# Patient Record
Sex: Female | Born: 1960 | Race: Black or African American | Hispanic: No | Marital: Single | Smoking: Current every day smoker
Health system: Southern US, Community
[De-identification: ages and names within clinical notes are randomized; demographics above are authoritative.]

## PROBLEM LIST (undated history)

## (undated) DIAGNOSIS — I10 Essential (primary) hypertension: Secondary | ICD-10-CM

## (undated) DIAGNOSIS — E785 Hyperlipidemia, unspecified: Secondary | ICD-10-CM

## (undated) DIAGNOSIS — B019 Varicella without complication: Secondary | ICD-10-CM

## (undated) DIAGNOSIS — F341 Dysthymic disorder: Secondary | ICD-10-CM

## (undated) DIAGNOSIS — M1990 Unspecified osteoarthritis, unspecified site: Secondary | ICD-10-CM

## (undated) DIAGNOSIS — F17200 Nicotine dependence, unspecified, uncomplicated: Secondary | ICD-10-CM

## (undated) DIAGNOSIS — F419 Anxiety disorder, unspecified: Secondary | ICD-10-CM

## (undated) DIAGNOSIS — Z90710 Acquired absence of both cervix and uterus: Secondary | ICD-10-CM

## (undated) HISTORY — DX: Hyperlipidemia, unspecified: E78.5

## (undated) HISTORY — DX: Essential (primary) hypertension: I10

## (undated) HISTORY — DX: Nicotine dependence, unspecified, uncomplicated: F17.200

## (undated) HISTORY — DX: Varicella without complication: B01.9

## (undated) HISTORY — DX: Unspecified osteoarthritis, unspecified site: M19.90

## (undated) HISTORY — DX: Anxiety disorder, unspecified: F41.9

## (undated) HISTORY — DX: Dysthymic disorder: F34.1

## (undated) HISTORY — PX: COLONOSCOPY: SHX174

---

## 1997-07-06 HISTORY — PX: OTHER SURGICAL HISTORY: SHX169

## 1997-07-06 HISTORY — PX: ABDOMINAL HYSTERECTOMY: SHX81

## 1998-10-21 ENCOUNTER — Ambulatory Visit (HOSPITAL_BASED_OUTPATIENT_CLINIC_OR_DEPARTMENT_OTHER): Admission: RE | Admit: 1998-10-21 | Discharge: 1998-10-21 | Payer: Self-pay | Admitting: Surgery

## 2001-01-03 ENCOUNTER — Ambulatory Visit (HOSPITAL_COMMUNITY): Admission: RE | Admit: 2001-01-03 | Discharge: 2001-01-03 | Payer: Self-pay | Admitting: Gastroenterology

## 2002-09-19 ENCOUNTER — Encounter: Payer: Self-pay | Admitting: Family Medicine

## 2002-09-19 ENCOUNTER — Ambulatory Visit (HOSPITAL_COMMUNITY): Admission: RE | Admit: 2002-09-19 | Discharge: 2002-09-19 | Payer: Self-pay | Admitting: Family Medicine

## 2002-11-14 ENCOUNTER — Ambulatory Visit (HOSPITAL_COMMUNITY): Admission: RE | Admit: 2002-11-14 | Discharge: 2002-11-14 | Payer: Self-pay | Admitting: Family Medicine

## 2002-11-14 ENCOUNTER — Encounter: Payer: Self-pay | Admitting: Family Medicine

## 2003-05-09 ENCOUNTER — Observation Stay (HOSPITAL_COMMUNITY): Admission: RE | Admit: 2003-05-09 | Discharge: 2003-05-10 | Payer: Self-pay | Admitting: *Deleted

## 2003-05-09 ENCOUNTER — Encounter (INDEPENDENT_AMBULATORY_CARE_PROVIDER_SITE_OTHER): Payer: Self-pay

## 2003-09-06 ENCOUNTER — Emergency Department (HOSPITAL_COMMUNITY): Admission: EM | Admit: 2003-09-06 | Discharge: 2003-09-06 | Payer: Self-pay | Admitting: Emergency Medicine

## 2004-07-08 ENCOUNTER — Encounter: Admission: RE | Admit: 2004-07-08 | Discharge: 2004-07-08 | Payer: Self-pay | Admitting: Obstetrics and Gynecology

## 2004-08-25 ENCOUNTER — Other Ambulatory Visit: Admission: RE | Admit: 2004-08-25 | Discharge: 2004-08-25 | Payer: Self-pay | Admitting: Obstetrics and Gynecology

## 2008-09-21 ENCOUNTER — Emergency Department (HOSPITAL_COMMUNITY): Admission: EM | Admit: 2008-09-21 | Discharge: 2008-09-21 | Payer: Self-pay | Admitting: Family Medicine

## 2009-01-27 ENCOUNTER — Emergency Department (HOSPITAL_COMMUNITY): Admission: EM | Admit: 2009-01-27 | Discharge: 2009-01-27 | Payer: Self-pay | Admitting: Family Medicine

## 2010-03-16 ENCOUNTER — Emergency Department (HOSPITAL_COMMUNITY): Admission: EM | Admit: 2010-03-16 | Discharge: 2010-03-16 | Payer: Self-pay | Admitting: Emergency Medicine

## 2010-06-16 ENCOUNTER — Ambulatory Visit: Payer: Self-pay | Admitting: Internal Medicine

## 2010-06-16 DIAGNOSIS — E781 Pure hyperglyceridemia: Secondary | ICD-10-CM | POA: Insufficient documentation

## 2010-06-16 DIAGNOSIS — I10 Essential (primary) hypertension: Secondary | ICD-10-CM | POA: Insufficient documentation

## 2010-06-16 DIAGNOSIS — F17200 Nicotine dependence, unspecified, uncomplicated: Secondary | ICD-10-CM | POA: Insufficient documentation

## 2010-07-08 ENCOUNTER — Telehealth: Payer: Self-pay | Admitting: Internal Medicine

## 2010-07-08 ENCOUNTER — Ambulatory Visit
Admission: RE | Admit: 2010-07-08 | Discharge: 2010-07-08 | Payer: Self-pay | Source: Home / Self Care | Attending: Internal Medicine | Admitting: Internal Medicine

## 2010-07-08 DIAGNOSIS — F341 Dysthymic disorder: Secondary | ICD-10-CM | POA: Insufficient documentation

## 2010-08-07 NOTE — Assessment & Plan Note (Signed)
Summary: new pt/high blood pressure/bcbs/#/lb   Vital Signs:  Patient profile:   50 year old female Height:      66 inches Weight:      139 pounds BMI:     22.52 O2 Sat:      93 % on Room air Temp:     97.2 degrees F oral Pulse rate:   65 / minute BP sitting:   174 / 80  (left arm) Cuff size:   regular  Vitals Entered By: Bill Salinas CMA (June 16, 2010 9:47 AM)  O2 Flow:  Room air CC: pt here to est care with primary md, and to discuss elevated BP/ ab   Primary Care Provider:  Jacques Navy MD  CC:  pt here to est care with primary md and and to discuss elevated BP/ ab.  History of Present Illness: Patient is seen acutely due to problems with high blood pressure.l She had an episode of syncope Sept 11, 2011 and was seen at the [facility] ED. AT that time she had trichomonas and was treated with Flagyl. She did have high blood pressure and was started on HCTZ 25mg  once daily. Her lab at that time was normal with a Creatinine of 0.8 and normal electrolytes. she reports that her blood pressure her BP was in the 150's. She has not had epistaxis or other neurologic signs or symptoms.   Preventive Screening-Counseling & Management  Alcohol-Tobacco     Alcohol drinks/day: <1     Alcohol type: beer     >5/day in last 3 mos: no     Smoking Status: current     Packs/Day: 1.0  Caffeine-Diet-Exercise     Caffeine use/day: 1 cup per day     Does Patient Exercise: no  Hep-HIV-STD-Contraception     Sun Exposure-Excessive: no  Safety-Violence-Falls     Seat Belt Use: yes     Helmet Use: n/a     Firearms in the Home: no firearms in the home     Smoke Detectors: yes     Violence in the Home: no risk noted     Sexual Abuse: no     Fall Risk: low  Current Medications (verified): 1)  Hydrochlorothiazide 25 Mg Tabs (Hydrochlorothiazide) .Marland Kitchen.. 1 Tablet Once Daily  Allergies (verified): No Known Drug Allergies  Past History:  Past Medical History: UCD OTHER AND UNSPECIFIED  HYPERLIPIDEMIA (ICD-272.4) UNSPECIFIED ESSENTIAL HYPERTENSION (ICD-401.9)  Past Surgical History: Hysterectomy-fibroids '99 single oopherectomy'99  G2P1 SAB 1  Family History: father - deceased @ 51: cancer - liver? 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 Uses condoms. Smoking Status:  current Packs/Day:  1.0 Caffeine use/day:  1 cup per day Does Patient Exercise:  no Sun Exposure-Excessive:  no Seat Belt Use:  yes Fall Risk:  low  Review of Systems       The patient complains of weight loss, dyspnea on exertion, prolonged cough, and severe indigestion/heartburn.  The patient denies anorexia, fever, weight gain, vision loss, decreased hearing, chest pain, syncope, headaches, hemoptysis, abdominal pain, incontinence, genital sores, suspicious skin lesions, difficulty walking, unusual weight change, abnormal bleeding, angioedema, and breast masses.         15 lbs over 12 months - unintential. Chronic AM cough- productive of a teaspoon of sputum. has GERD  and self medicates with H2 blocker OTC  Physical Exam  General:  Well-developed,well-nourished,in no acute distress; alert,appropriate and cooperative throughout examination Head:  Normocephalic and atraumatic without obvious abnormalities. No apparent alopecia or balding. Eyes:  pupils equal, pupils round, corneas and lenses clear, and no injection.   Ears:  External ear exam shows no significant lesions or deformities.  Otoscopic examination reveals clear canals, tympanic membranes are intact bilaterally without bulging, retraction, inflammation or discharge. Hearing is grossly normal bilaterally. Mouth:  Oral mucosa and oropharynx without lesions or exudates.  Teeth in good repair but need cleaning. Missing premolars  maxilla Neck:  supple, full ROM, no thyromegaly, and no carotid bruits.   Chest Wall:  No deformities, masses, or tenderness noted. Breasts:  No mass, nodules, thickening, tenderness, bulging, retraction, inflamation, nipple discharge or skin changes noted.   Lungs:  normal respiratory effort, normal breath sounds, no crackles, and no wheezes.   Heart:  normal rate and regular rhythm.   Abdomen:  soft, non-tender, normal bowel sounds, no guarding, and no hepatomegaly.   Msk:  normal ROM, no joint tenderness, no joint swelling, and no joint deformities.   Pulses:  2+ radial bilaterally Extremities:  No clubbing, cyanosis, edema, or deformity noted with normal full range of motion of all joints.   Neurologic:  alert & oriented X3, cranial nerves II-XII intact, gait normal, and DTRs symmetrical and normal.   Skin:  turgor normal, color normal, no rashes, and no suspicious lesions.   Cervical Nodes:  no anterior cervical adenopathy and no posterior cervical adenopathy.  No supraclavicular nodes Axillary Nodes:  no R axillary adenopathy and no L axillary adenopathy.   Psych:  Oriented X3, normally interactive, good eye contact, and not anxious appearing.     Impression & Recommendations:  Problem # 1:  UNSPECIFIED ESSENTIAL HYPERTENSION (ICD-401.9) Poor control on HCTZ alone. No stigmata of advanced disease. Normal renal function in Sept '11  Plan - CXR           lisinopril/hct 10/12.5           ROV 3 weeks for f/u and Bmet  Her updated medication list for this problem includes:    Lisinopril-hydrochlorothiazide 10-12.5 Mg Tabs (Lisinopril-hydrochlorothiazide) .Marland Kitchen... 1 by mouth once daily for high blood pressure  Problem # 2:  SMOKER (ICD-305.1) Counseled to stop smoking  Orders: T-2 View CXR (71020TC)  Problem # 3:  WEIGHT LOSS, ABNORMAL (ICD-783.21) patient with 215lb weight loss over the past year. Pt attributes this to poor appetite when stressed/depressed. She is a smoker. No  change in bowel habit. No night sweats or adenopathy. Exam with normal breast exam. She is s/p hysterectomy  Plan - PA Lat chest x-ray           If negative x-ray and continued weight loss will need colonoscopy.  Problem # 4:  Preventive Health Care (ICD-V70.0) History significant for BP. Exam is normal. Labs from ED reviewed and normal, including normal breast exam. Patient is s/p hysterectomy and does not need Pelvic and PAP and did have pelvic Sept '11 in ED. She will need routine lab, e.g. lipid panel.  In summary - a nice patient welcomed and oriented to the practice. She will return in 3 weeks as noted.   Complete Medication List: 1)  Lisinopril-hydrochlorothiazide 10-12.5 Mg Tabs (Lisinopril-hydrochlorothiazide) .Marland Kitchen.. 1 by mouth once daily for high blood pressure  DG CHEST 2 VIEW - [id number]   Clinical Data: 15, weight loss, smoker.   CHEST -  2 VIEW   Comparison: None   Findings: Heart and mediastinal contours are within normal limits. No focal opacities or effusions.  No acute bony abnormality.   IMPRESSION: No active disease.   Read By:  Charlett Nose,  M.D.Prescriptions: LISINOPRIL-HYDROCHLOROTHIAZIDE 10-12.5 MG TABS (LISINOPRIL-HYDROCHLOROTHIAZIDE) 1 by mouth once daily for high blood pressure  #30 x 12   Entered and Authorized by:   Jacques Navy MD   Signed by:   Jacques Navy MD on 06/16/2010   Method used:   [facility] to        [facility]  [address] [phone]* (retail)       [address]       [place], [place]  [address]       Ph: [phone] or [phone]       Fax: [phone]   RxID:   [id number]    Orders Added: 1)  T-2 View CXR [71020TC] 2)  New Patient Level IV [36644]

## 2010-08-07 NOTE — Progress Notes (Signed)
Summary: REQ FOR RX   Phone Note Call from Patient Call back at Home Phone [phone]   Summary of Call: Patient is requesting a call back regarding an rx.  Initial call taken by: Lamar Sprinkles, CMA,  July 08, 2010 3:16 PM  Follow-up for Phone Call        Pt would like rx for depression & to help w/sleep. She would like a call when complete. [facility]  Follow-up by: Lamar Sprinkles, CMA,  July 08, 2010 6:02 PM  Additional Follow-up for Phone Call Additional follow up Details #1::        OK - we discussed this at today's visit. Sertraline 50mg  1 by mouth at bedtime. # 30, refillx 5  Called patient Additional Follow-up by: Jacques Navy MD,  July 08, 2010 6:10 PM    New/Updated Medications: SERTRALINE HCL 50 MG TABS (SERTRALINE HCL) 1 by mouth at bedtime for depression and anxiety Prescriptions: SERTRALINE HCL 50 MG TABS (SERTRALINE HCL) 1 by mouth at bedtime for depression and anxiety  #30 x 5   Entered and Authorized by:   Jacques Navy MD   Signed by:   Jacques Navy MD on 07/08/2010   Method used:   [facility]        [facility]  [address] [phone]* (retail)       [address]       [place], [place]  [address]       Ph: [phone] or [phone]       Fax: [phone]   RxID:   [id number]

## 2010-08-07 NOTE — Assessment & Plan Note (Signed)
Summary: 3 wk f/u #/cd   Vital Signs:  Patient profile:   50 year old female Height:      66 inches Weight:      136 pounds BMI:     22.03 Temp:     97.5 degrees F oral BP sitting:   146 / 86  (left arm) Cuff size:   regular  Vitals Entered By: Bill Salinas CMA (July 08, 2010 10:58 AM) CC: pt here for follow up on BP after starting lisinopril/ ab   Primary Care Provider:  Jacques Navy MD  CC:  pt here for follow up on BP after starting lisinopril/ ab.  History of Present Illness: Patient presents for follow up of hypertension. she was seen recently and started on lisinopril/Hct. She is tolerting medication well without adverse effects.  She reports that she is not doing well. On questioning her she reports that things are very stressfull but she does not give details. She does admit to change in appetite, anhedonia, tearfullness, sadness, perseveration, change in sleep pattern. She denies any abuse. She has been talking with her sister and mother but has not engaged in counseling. She does have a h/o emotional problems with prior h/o potential self-harm for which she was in counseling. She denies any ideation of self-harm at this time.   Current Medications (verified): 1)  Lisinopril-Hydrochlorothiazide 10-12.5 Mg Tabs (Lisinopril-Hydrochlorothiazide) .Marland Kitchen.. 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 Uses condoms.  Review of Systems       The patient  complains of depression.  The patient denies anorexia, weight loss, weight gain, chest pain, dyspnea on exertion, abdominal pain, severe indigestion/heartburn, difficulty walking, unusual weight change, and abnormal bleeding.    Physical Exam  General:  Well-developed,well-nourished,in no acute distress; alert,appropriate and cooperative throughout examination Head:  normocephalic and atraumatic.   Eyes:  pupils equal and pupils round.   Lungs:  normal respiratory effort.   Heart:  normal rate and regular rhythm.   Neurologic:  alert & oriented X3, cranial nerves II-XII intact, and gait normal.   Skin:  turgor normal and color normal.   Psych:  Oriented X3, memory intact for recent and remote, normally interactive, good eye contact, labile affect, and tearful.     Impression & Recommendations:  Problem # 1:  UNSPECIFIED ESSENTIAL HYPERTENSION (ICD-401.9)  Her updated medication list for this problem includes:    Lisinopril-hydrochlorothiazide 10-12.5 Mg Tabs (Lisinopril-hydrochlorothiazide) .Marland Kitchen... 1 by mouth once daily for high blood pressure  BP today: 146/86 Prior BP: 174/80 (06/16/2010)  Improved blood pressure althoug not at goal. Extenuating circumstances, see below, may be contributing to elevated readings.  Plan - continue present medication           recheck in 1 month  Problem # 2:  ANXIETY DEPRESSION (ICD-300.4) Patient with multiple vegative sings of depression. she does not share the content of her problems. Discussed with her the options of medicaton to help her symptoms. Also strongly encouraged her to return to the counselor she had worked with in the past.  Plan - counseling           medical mgt - sertraline 50mg  at bedtime           return in 1 month for follow-up.  Complete Medication List: 1)  Lisinopril-hydrochlorothiazide 10-12.5 Mg Tabs (Lisinopril-hydrochlorothiazide) .Marland Kitchen.. 1 by mouth once daily for high blood pressure 2)  Sertraline Hcl 50 Mg Tabs  (Sertraline hcl) .Marland Kitchen.. 1 by mouth at bedtime for depression and anxiety   Orders Added: 1)  Est. Patient Level III [16109]

## 2010-08-08 ENCOUNTER — Ambulatory Visit (INDEPENDENT_AMBULATORY_CARE_PROVIDER_SITE_OTHER): Payer: BC Managed Care – PPO | Admitting: Internal Medicine

## 2010-08-08 ENCOUNTER — Ambulatory Visit: Admit: 2010-08-08 | Payer: Self-pay | Admitting: Internal Medicine

## 2010-08-08 ENCOUNTER — Encounter: Payer: Self-pay | Admitting: Internal Medicine

## 2010-08-08 DIAGNOSIS — I10 Essential (primary) hypertension: Secondary | ICD-10-CM

## 2010-08-08 DIAGNOSIS — F341 Dysthymic disorder: Secondary | ICD-10-CM

## 2010-08-13 NOTE — Assessment & Plan Note (Signed)
Summary: ONE MONTH FOLLOW UP - LB   Vital Signs:  Patient profile:   50 year old female Height:      66 inches Weight:      137 pounds BMI:     22.19 O2 Sat:      99 % on Room air Temp:     97.6 degrees F oral Pulse rate:   53 / minute BP sitting:   122 / 80  (left arm) Cuff size:   regular  Vitals Entered By: Bill Salinas CMA (August 08, 2010 1:19 PM)  O2 Flow:  Room air CC: pt here for 3 month follow up. She would like to discuss sertraline and a poss alternative/ ab   Primary Care Provider:  Jacques Navy MD  CC:  pt here for 3 month follow up. She would like to discuss sertraline and a poss alternative/ ab.  History of Present Illness: Ms. Halpin returns for follow-up of depression and use of sertraline. Unfortunately she was intolerant with dizzyness and impaired cognition. She stopped meds after 2 weeks. She has not had any counseling - hard to find the time and the $$. She relates that she has been talking to friends and letting go of some of the tension and does feel a little better. She is interested in trying another product for her depression.   Current Medications (verified): 1)  Lisinopril-Hydrochlorothiazide 10-12.5 Mg Tabs (Lisinopril-Hydrochlorothiazide) .Marland Kitchen.. 1 By Mouth Once Daily For High Blood Pressure 2)  Sertraline Hcl 50 Mg Tabs (Sertraline Hcl) .Marland Kitchen.. 1 By Mouth At Bedtime For Depression and Anxiety  Allergies (verified): No Known Drug Allergies PMH-FH-SH reviewed-no changes except otherwise noted  Physical Exam  General:  Well-developed,well-nourished,in no acute distress; alert,appropriate and cooperative throughout examination Head:  normocephalic and atraumatic.   Eyes:  C&S clear Lungs:  normal respiratory effort.   Heart:  normal rate and regular rhythm.     Impression & Recommendations:  Problem # 1:  ANXIETY DEPRESSION (ICD-300.4) a little better but still depressed and emotional  Plan - encouraged counseling - perhaps ministerial  through her church           trial of pristiq - 50mg  once daily #7 - if tolerated will Rx for Venlafaxine 75 ER.   Problem # 2:  UNSPECIFIED ESSENTIAL HYPERTENSION (ICD-401.9)  Her updated medication list for this problem includes:    Lisinopril-hydrochlorothiazide 10-12.5 Mg Tabs (Lisinopril-hydrochlorothiazide) .Marland Kitchen... 1 by mouth once daily for high blood pressure  BP today: 122/80 Prior BP: 146/86 (07/08/2010)  Good control.   Complete Medication List: 1)  Lisinopril-hydrochlorothiazide 10-12.5 Mg Tabs (Lisinopril-hydrochlorothiazide) .Marland Kitchen.. 1 by mouth once daily for high blood pressure 2)  Sertraline Hcl 50 Mg Tabs (Sertraline hcl) .Marland Kitchen.. 1 by mouth at bedtime for depression and anxiety   Orders Added: 1)  Est. Patient Level II [29562]

## 2010-09-18 LAB — URINALYSIS, ROUTINE W REFLEX MICROSCOPIC
Bilirubin Urine: NEGATIVE
Glucose, UA: NEGATIVE mg/dL
Ketones, ur: NEGATIVE mg/dL
Nitrite: NEGATIVE
Protein, ur: NEGATIVE mg/dL
Specific Gravity, Urine: 1.017 (ref 1.005–1.030)
Urobilinogen, UA: 0.2 mg/dL (ref 0.0–1.0)
pH: 5.5 (ref 5.0–8.0)

## 2010-09-18 LAB — URINE MICROSCOPIC-ADD ON

## 2010-09-18 LAB — POCT I-STAT, CHEM 8
BUN: 11 mg/dL (ref 6–23)
Calcium, Ion: 1.1 mmol/L — ABNORMAL LOW (ref 1.12–1.32)
Chloride: 106 mEq/L (ref 96–112)
Creatinine, Ser: 0.8 mg/dL (ref 0.4–1.2)
Glucose, Bld: 101 mg/dL — ABNORMAL HIGH (ref 70–99)
HCT: 40 % (ref 36.0–46.0)
Hemoglobin: 13.6 g/dL (ref 12.0–15.0)
Potassium: 3.8 mEq/L (ref 3.5–5.1)
Sodium: 142 mEq/L (ref 135–145)
TCO2: 24 mmol/L (ref 0–100)

## 2010-09-18 LAB — WET PREP, GENITAL: Yeast Wet Prep HPF POC: NONE SEEN

## 2010-09-18 LAB — POCT PREGNANCY, URINE: Preg Test, Ur: NEGATIVE

## 2010-09-18 LAB — RPR: RPR Ser Ql: NONREACTIVE

## 2010-09-18 LAB — GC/CHLAMYDIA PROBE AMP, GENITAL
Chlamydia, DNA Probe: NEGATIVE
GC Probe Amp, Genital: NEGATIVE

## 2010-11-21 NOTE — Op Note (Signed)
NAME:  Jessica Lara, Jessica Lara                         ACCOUNT NO.:  [id number]   MEDICAL RECORD NO.:  [id number]                   PATIENT TYPE:  OBV   LOCATION:  9326                                 FACILITY:  [facility]   PHYSICIAN:  Tracie Harrier, M.D.              DATE OF BIRTH:  [birth year]   DATE OF PROCEDURE:  05/10/2003  DATE OF DISCHARGE:                                 OPERATIVE REPORT   PREOPERATIVE DIAGNOSES:  1. Abnormal uterine bleeding.  2. Bilateral hydrosalpinges.  3. Uterine fibroid.   POSTOPERATIVE DIAGNOSES:  1. Abnormal uterine bleeding.  2. Bilateral hydrosalpinges.  3. Uterine fibroid.  4. Bilateral and moderate pelvic adhesions.   PROCEDURES:  1. Laparoscopically-assisted vaginal hysterectomy.  2. Lysis of adhesions.  3. Bilateral salpingectomy.   SURGEON:  Tracie Harrier, M.D.   ASSISTANTFreddy Finner, M.D.   ANESTHESIA:  General.   ESTIMATED BLOOD LOSS:  100 mL.   COMPLICATIONS:  None.   FINDINGS:  At the time of hysterectomy, pelvic adhesions were noted in the  posterior aspect of the pelvis.  This was most notable on the right pelvic  sidewall.  There were also multiple adhesions between the uterus, adnexa,  and the sigmoid colon.   Otherwise, the ovaries were visualized and noted to be normal.  The bowel  was visualized and noted to be normal.  The appendix was not visualized and  noted to be normal.  The appendix was not visualized.   DESCRIPTION OF PROCEDURE:  The patient was taken to the operating room,  where a general endotracheal anesthetic was administered.  The patient was  placed on the operating table in the dorsal lithotomy position.  The  abdomen, perineum, and vagina were prepped and draped in the usual sterile  fashion with Betadine and sterile drapes.  A red rubber catheter was used to  empty the bladder.  Next, a small transverse infraumbilical skin incision  was made through which a Veress needle was inserted atraumatically  into the  abdominal cavity.  A pneumoperitoneum was then created with carbon dioxide  gas until an intra-abdominal pressure of 15 mmHg was created.  The Veress  needle was then removed and the disposable laparoscopic trocar was inserted  atraumatically into the abdominal cavity.  A second incision was made two  fingerbreadths above the pubic symphysis and in the midline.  Through this a  5 mm trocar was inserted atraumatically.  Lysis of adhesions was then  carried out using the laparoscopic scissors until the anatomy was  normalized.  The tubes were then removed with the assistance of the Gyrus 5  mm bipolar device.  The tubes were dissected off the underlying mesosalpinx  with a burn and cut technique.  Good hemostasis was noted and the tubes were  dissected free.  Attention was then turned to hysterectomy.   The uterine-ovarian ligaments were grasped bilaterally, cauterized  thoroughly, and divided using the Gyrus device.  Dissection was then carried  out to remove the uterus in the standard fashion until about the level of  the uterine arteries bilaterally. Again a burn and cut technique was  undertaken with the Gyrus device.  All vascular pedicles were created close  to the body of the uterus and again dissection carried out inferiorly with  good hemostasis and good dissection achieved.  The uterus was removed until  the level of the uterine artery.  A Foley catheter was inserted during this  time for operative ease.  The pelvis was thoroughly irrigated with the  Nezhat-Dorsey irrigator and hemostasis was noted.   Next the vaginal portion of this procedure was undertaken.  A weighted  speculum was placed in the posterior fornix of the vagina.  The cervix was  grasped with a Jacobs tenaculum and the posterior cul-de-sac was sharply and  atraumatically entered.  The uterosacral ligaments were grasped bilaterally  using the LigaSure forceps device.  All vascular pedicles were then   cauterized and divided with the Mayo scissors.  A circumferential incision  was made around the anterior portion of the cervix and the bladder flap was  dissected cephalad using blunt and sharp dissection.  The anterior cul-de-  sac was atraumatically entered and a curved Deaver placed behind this to  assure its protection during the procedure.  Successive bites were then  carried up the body of the uterus using the LigaSure device.  All vascular  pedicles were once again created close to the body of the uterus, cauterized  thoroughly, and divided sharply.  This was done until the operative suture  lines were met.  The uterus was then handed off in total.  Attention was  then turned to closure.  First the uterosacral ligaments were attached to  the upper vagina using a transfixing suture of 0 Vicryl bilaterally.  The  posterior cul-de-sac was obliterated by tying the uterosacral ligaments in  the midline using a suture of 0 Vicryl suture.  The vaginal cuff was then  closed in an anterior to posterior fashion using multiple interrupted  sutures of 0 Vicryl.  Good hemostasis was noted.  Good cuff reapproximation  was identified.   After the vaginal portion of this was completed, the abdomen was once again  insufflated with carbon dioxide and the operative areas were visualized  thoroughly.  The pelvis was once again irrigated using the Nezhat-Dorsey  irrigator and hemostasis was noted.  Attention was then turned to closure.  All gas was allowed to escape from the abdomen and all abdominal instruments  were removed.  The infraumbilical skin incision was closed first with three  interrupted sutures of 0 Vicryl on the muscle fascia.  The skin of the  umbilical site was closed with multiple interrupted sutures of 3-0 Vicryl  Rapide.  The suprapubic site was closed with Dermabond glue.   The patient was then awakened, extubated, and taken to the recovery room in good condition.  There were no  perioperative complications, and blood loss  was 100 mL.  The patient received an antibiotic prior to surgery.  Final  sponge and needle count was correct.                                               Tracie Harrier, M.D.  REG/MEDQ  D:  05/10/2003  T:  05/10/2003  Job:  [id number]

## 2010-11-21 NOTE — Procedures (Signed)
[facility]  Patient:    Jessica Lara, Jessica Lara                      MRN: [id number] Proc. Date: 01/03/01 Adm. Date:  [id number] Attending:  Orland Mustard CC:         Al Decant. Janey Greaser, M.D.   Procedure Report  PROCEDURE PERFORMED:  Colonoscopy.  ENDOSCOPIST:  Llana Aliment. Randa Evens, M.D.  MEDICATIONS USED:  Fentanyl 87.5 mcg, Versed 10 mg IV.  INSTRUMENT:  Pediatric colonoscope.  INDICATIONS:  The patient is a 50 year old woman with rectal bleeding.  This has been presumed to be due to an anal fissure.  She has had previous anal fissures and has had repair for that.  Of note, however, is that her father died of colon cancer at age 3.  Given the fact that her father died at such a young age and she is having progressive rectal bleeding, colonoscopy was performed.  DESCRIPTION OF PROCEDURE:  The procedure had been explained to the patient and consent obtained.  With the patient in the left lateral decubitus position, a digital exam was performed and the pediatric Olympus video colonoscope was inserted.  There were obvious surgical changes to the anal area.  We were able to easily advance to the cecum without difficulty.  The ileocecal valve and appendiceal orifice were seen.  The scope was withdrawn.  The cecum, ascending colon, hepatic flexure, transverse colon, splenic flexure, descending and sigmoid colon were seen well upon withdrawal.  No polyps were seen.  Scope withdrawn to the rectum.  The rectum was free of polyps.  Upon removing the scope, the patient was seen to have internal hemorrhoids.  The patient tolerated the procedure well.  Maintained on low flow oxygen and pulse oximeter throughout the procedure with no obvious problem.  ASSESSMENT: 1. Internal hemorrhoids probably the source of her bleeding. 2. No evidence of polyps in this very high risk individual.  PLAN:  Will recommend repeating in five years.  Will give a hemorrhoid  sheet. See back as needed. DD:  01/03/01 TD:  01/03/01 Job: [id number]

## 2010-11-21 NOTE — H&P (Signed)
NAME:  KELECHI, ASTARITA                         ACCOUNT NO.:  [id number]   MEDICAL RECORD NO.:  [id number]                   PATIENT TYPE:  OBV   LOCATION:  NA                                   FACILITY:  [facility]   PHYSICIAN:  Tracie Harrier, M.D.              DATE OF BIRTH:  1961-02-03   DATE OF ADMISSION:  DATE OF DISCHARGE:                                HISTORY & PHYSICAL   HISTORY OF PRESENT ILLNESS:  Ms. Hudson is a 49 year old female gravida 2,  para 1 with uterine fibroids and bilateral hydrosalpinges.  She is admitted  to undergo definitive therapy of abnormal uterine bleeding and uterine  fibroids.  She has had heavy, lengthy menstrual cycles occurring frequently  about every two weeks.  This is clearly abnormal uterine bleeding.  On  ultrasound, she is also noted to have bilateral hydrosalpinges.  After  informed consent, she is admitted to undergo definitive therapy of these  above-mentioned problems.   She is a smoker of 1/2 pack per day but is generally in good health.   PAST MEDICAL HISTORY:  None.   PAST SURGICAL HISTORY:  1. Hysteroscopy with resection of uterine fibroid.  2. Repair of anal fistula.   PAST OBSTETRICAL HISTORY:  1. SAB x1.  2. Normal spontaneous vaginal delivery at about 36 weeks.   CURRENT MEDICATIONS:  Oral contraception.   ALLERGIES:  None known.   PHYSICAL EXAMINATION:  VITAL SIGNS:  Stable, blood pressure 120/78.  GENERAL:  She is a well-developed, well-nourished female in no acute  distress.  HEENT:  Within normal limits.  NECK:  Supple without adenopathy or thyromegaly.  HEART:  Regular rate and rhythm without murmurs, rubs, or gallops.  LUNGS:  Clear to auscultation.  BREASTS:  Exam deferred.  ABDOMEN:  Soft and benign without masses, tenderness, hernia, or  organomegaly.  PELVIC:  Normal external female genitalia, vagina and cervix clear.  The  uterus is upper limits of normal.  The adnexa is clear of mass.   Ultrasound  consistent with one intramural fibroid and bilateral  hydrosalpinges.   ASSESSMENT:  1. Abnormal uterine bleeding.  2. Bilateral hydrosalpinges.  3. Uterine fibroid.    PLAN:  1. Laparoscopic-assisted vaginal hysterectomy.  2. Bilateral salpingectomy.   DISCUSSION:  The risks and benefits of this procedure is discussed with the  patient.  The risks of bleeding, infection, risks of injuries to surrounding  organs is reviewed.  Also the possibility for laparotomy discussed with the  patient.  Questions are answered regarding this procedure and alternative  therapies.                                               Tracie Harrier, M.D.    REG/MEDQ  D:  05/08/2003  T:  05/08/2003  Job:  [id number]

## 2011-05-13 ENCOUNTER — Encounter: Payer: Self-pay | Admitting: Internal Medicine

## 2011-05-14 ENCOUNTER — Other Ambulatory Visit (INDEPENDENT_AMBULATORY_CARE_PROVIDER_SITE_OTHER): Payer: BC Managed Care – PPO

## 2011-05-14 ENCOUNTER — Ambulatory Visit (INDEPENDENT_AMBULATORY_CARE_PROVIDER_SITE_OTHER): Payer: BC Managed Care – PPO | Admitting: Internal Medicine

## 2011-05-14 ENCOUNTER — Encounter: Payer: Self-pay | Admitting: Internal Medicine

## 2011-05-14 VITALS — BP 140/82 | HR 63 | Temp 98.0°F

## 2011-05-14 DIAGNOSIS — R011 Cardiac murmur, unspecified: Secondary | ICD-10-CM

## 2011-05-14 DIAGNOSIS — R209 Unspecified disturbances of skin sensation: Secondary | ICD-10-CM

## 2011-05-14 DIAGNOSIS — R55 Syncope and collapse: Secondary | ICD-10-CM

## 2011-05-14 DIAGNOSIS — R200 Anesthesia of skin: Secondary | ICD-10-CM

## 2011-05-14 DIAGNOSIS — I10 Essential (primary) hypertension: Secondary | ICD-10-CM

## 2011-05-14 LAB — BASIC METABOLIC PANEL
BUN: 21 mg/dL (ref 6–23)
CO2: 29 mEq/L (ref 19–32)
Calcium: 9.6 mg/dL (ref 8.4–10.5)
Chloride: 106 mEq/L (ref 96–112)
Creatinine, Ser: 0.8 mg/dL (ref 0.4–1.2)
GFR: 99.04 mL/min (ref >60.00)
Glucose, Bld: 118 mg/dL — ABNORMAL HIGH (ref 70–99)
Potassium: 4.2 mEq/L (ref 3.5–5.1)
Sodium: 142 mEq/L (ref 135–145)

## 2011-05-14 LAB — HEPATIC FUNCTION PANEL
ALT: 16 U/L (ref 0–35)
AST: 21 U/L (ref 0–37)
Albumin: 4.9 g/dL (ref 3.5–5.2)
Alkaline Phosphatase: 51 U/L (ref 39–117)
Bilirubin, Direct: 0 mg/dL (ref 0.0–0.3)
Total Bilirubin: 0.5 mg/dL (ref 0.3–1.2)
Total Protein: 8 g/dL (ref 6.0–8.3)

## 2011-05-14 LAB — CBC WITH DIFFERENTIAL/PLATELET
Basophils Absolute: 0 10*3/uL (ref 0.0–0.1)
Basophils Relative: 0.4 % (ref 0.0–3.0)
Eosinophils Absolute: 0 10*3/uL (ref 0.0–0.7)
Eosinophils Relative: 0.8 % (ref 0.0–5.0)
HCT: 38.6 % (ref 36.0–46.0)
Hemoglobin: 13.3 g/dL (ref 12.0–15.0)
Lymphocytes Relative: 22.6 % (ref 12.0–46.0)
Lymphs Abs: 1.1 10*3/uL (ref 0.7–4.0)
MCHC: 34.3 g/dL (ref 30.0–36.0)
MCV: 92.5 fl (ref 78.0–100.0)
Monocytes Absolute: 0.4 10*3/uL (ref 0.1–1.0)
Monocytes Relative: 8.6 % (ref 3.0–12.0)
Neutro Abs: 3.3 10*3/uL (ref 1.4–7.7)
Neutrophils Relative %: 67.6 % (ref 43.0–77.0)
Platelets: 208 10*3/uL (ref 150.0–400.0)
RBC: 4.18 Mil/uL (ref 3.87–5.11)
RDW: 13.4 % (ref 11.5–14.6)
WBC: 4.9 10*3/uL (ref 4.5–10.5)

## 2011-05-14 LAB — TSH: TSH: 1.09 u[IU]/mL (ref 0.35–5.50)

## 2011-05-14 MED ORDER — ASPIRIN EC 81 MG PO TBEC: 81.0000 mg | DELAYED_RELEASE_TABLET | Freq: Every day | ORAL | Status: DC

## 2011-05-14 MED ORDER — LISINOPRIL-HYDROCHLOROTHIAZIDE 20-12.5 MG PO TABS: 1.0000 | ORAL_TABLET | Freq: Every day | ORAL | Status: DC

## 2011-05-14 NOTE — Progress Notes (Signed)
  Subjective:    Patient ID: Jessica Lara, female    DOB: 1961/04/14, 50 y.o.   MRN: [id number]  HPI  complains of ?uncontrolled blood pressure - "feels high" but not verified during spells Onset "spells" 3 months ago Episodic spells of dizziness, diffuse body numbness, and tunnel sensation  Episodes occur 3-4x/week, usually at work Near syncope sensation x 2 but no loss of consciousness or incontinence, seizure or Reports compliance with prescribed antihypertensives, but off sertraline since spring 2012 - takes "prn" No headache, no vision change, no unilateral weakness or numbness No dysarthria or facial asymmetry -  no trouble with hand coordination or writing coordination but numb in R index finger x 2 weeks  Past Medical History  Diagnosis Date  . ANXIETY DEPRESSION   . Other and unspecified hyperlipidemia   . SMOKER   . Unspecified essential hypertension      Review of Systems  Constitutional: Negative for fever and fatigue.  Respiratory: Negative for cough and shortness of breath.   Cardiovascular: Negative for palpitations and leg swelling.  Neurological: Negative for tremors, speech difficulty, weakness and headaches.       Objective:   Physical Exam BP 140/82  Pulse 63  Temp(Src) 98 F (36.7 C) (Oral)  SpO2 99% Wt Readings from Last 3 Encounters:  08/08/10 137 lb (62.143 kg)  07/08/10 136 lb (61.689 kg)  06/16/10 139 lb (63.05 kg)   Constitutional: She appears well-developed and well-nourished. No distress.  HENT: Head: Normocephalic and atraumatic. Ears: B TMs ok, no erythema or effusion; Nose: Nose normal.  Mouth/Throat: Oropharynx is clear and moist. No oropharyngeal exudate.  Eyes: Conjunctivae and EOM are normal. Pupils are equal, round, and reactive to light. No scleral icterus.  Neck: Normal range of motion. Neck supple. No carotid bruits. No JVD present. No thyromegaly present.  Cardiovascular: Normal rate, regular rhythm and normal heart sounds.   2/6 holosyst murmur heard. No BLE edema. Pulmonary/Chest: Effort normal and breath sounds normal. No respiratory distress. She has no wheezes. Mskel: R hand without edema, deformity - B equal grip Neurological: She is alert and oriented to person, place, and time. No cranial nerve deficit. Coordination normal. Normal F>N Psychiatric: She has a normal mood and affect. Her behavior is normal. Judgment and thought content normal.   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

## 2011-05-14 NOTE — Patient Instructions (Signed)
It was good to see you today. You should consider giving up cigarettes and smoking. This is very bad for your health. If we can help you quit, please let us know Start baby aspirin once daily Increase dose of lisinopril HCT today for blood pressure control - Your prescription(s) have been submitted to your pharmacy. Please take as directed and contact our office if you believe you are having problem(s) with the medication(s). Test(s) ordered today. Your results will be called to you after review (48-72hours after test completion). If any changes need to be made, you will be notified at that time. we'll make referral for echocardiogram to evaluate her heart. Our office will contact you regarding appointment(s) once made. Please schedule followup in 2-4 weeks with Dr. Debby Bud, call sooner if problems.

## 2011-05-15 ENCOUNTER — Encounter: Payer: Self-pay | Admitting: Internal Medicine

## 2011-05-15 DIAGNOSIS — R739 Hyperglycemia, unspecified: Secondary | ICD-10-CM | POA: Insufficient documentation

## 2011-05-19 LAB — HEMOGLOBIN A1C: Hgb A1c MFr Bld: 5.7 % (ref 4.6–6.5)

## 2011-05-26 ENCOUNTER — Ambulatory Visit (HOSPITAL_COMMUNITY): Payer: BC Managed Care – PPO | Attending: Cardiovascular Disease

## 2011-05-26 ENCOUNTER — Ambulatory Visit: Payer: BC Managed Care – PPO | Admitting: Internal Medicine

## 2011-05-26 DIAGNOSIS — I359 Nonrheumatic aortic valve disorder, unspecified: Secondary | ICD-10-CM | POA: Insufficient documentation

## 2011-05-26 DIAGNOSIS — I379 Nonrheumatic pulmonary valve disorder, unspecified: Secondary | ICD-10-CM | POA: Insufficient documentation

## 2011-05-26 DIAGNOSIS — I059 Rheumatic mitral valve disease, unspecified: Secondary | ICD-10-CM | POA: Insufficient documentation

## 2011-05-26 DIAGNOSIS — R011 Cardiac murmur, unspecified: Secondary | ICD-10-CM | POA: Insufficient documentation

## 2011-05-26 DIAGNOSIS — I10 Essential (primary) hypertension: Secondary | ICD-10-CM

## 2011-05-26 DIAGNOSIS — R55 Syncope and collapse: Secondary | ICD-10-CM | POA: Insufficient documentation

## 2011-05-26 DIAGNOSIS — I079 Rheumatic tricuspid valve disease, unspecified: Secondary | ICD-10-CM | POA: Insufficient documentation

## 2011-06-02 ENCOUNTER — Ambulatory Visit (INDEPENDENT_AMBULATORY_CARE_PROVIDER_SITE_OTHER): Payer: BC Managed Care – PPO | Admitting: Internal Medicine

## 2011-06-02 DIAGNOSIS — Z0289 Encounter for other administrative examinations: Secondary | ICD-10-CM

## 2011-06-02 DIAGNOSIS — I10 Essential (primary) hypertension: Secondary | ICD-10-CM

## 2011-06-03 NOTE — Progress Notes (Signed)
  Subjective:    Patient ID: Jessica Lara, female    DOB: 07-28-1960, 50 y.o.   MRN: [id number]  HPI No show - no charge    Review of Systems     Objective:   Physical Exam        Assessment & Plan:

## 2011-08-24 ENCOUNTER — Emergency Department (INDEPENDENT_AMBULATORY_CARE_PROVIDER_SITE_OTHER)
Admission: EM | Admit: 2011-08-24 | Discharge: 2011-08-24 | Disposition: A | Discharge status: Home / Self Care | Payer: BC Managed Care – PPO | Source: Home / Self Care | Attending: Family Medicine | Admitting: Family Medicine

## 2011-08-24 ENCOUNTER — Encounter (HOSPITAL_COMMUNITY): Payer: Self-pay | Admitting: Cardiology

## 2011-08-24 DIAGNOSIS — B354 Tinea corporis: Secondary | ICD-10-CM

## 2011-08-24 MED ORDER — HYDROXYZINE HCL 50 MG PO TABS: 50.0000 mg | ORAL_TABLET | Freq: Three times a day (TID) | ORAL | Status: AC | PRN

## 2011-08-24 MED ORDER — KETOCONAZOLE 2 % EX GEL: CUTANEOUS | Status: DC

## 2011-08-24 MED ORDER — FLUCONAZOLE 200 MG PO TABS: ORAL_TABLET | ORAL | Status: DC

## 2011-08-24 NOTE — Discharge Instructions (Signed)
Use Selsum blue shampoo in affected skin at bed time and rinse the next day. Use the prescribed medications as instructed. Followup with the dermatologist number provided above if persistent or worsening symptoms.    Ringworm, Body [Tinea Corporis] Ringworm is a fungal infection of the skin and hair. Another name for this problem is Tinea Corporis. It has nothing to do with worms. A fungus is an organism that lives on dead cells (the outer layer of skin). It can involve the entire body. It can spread from infected pets. Tinea corporis can be a problem in wrestlers who may get the infection form other players/opponents, equipment and mats. DIAGNOSIS  A skin scraping can be obtained from the affected area and by looking for fungus under the microscope. This is called a KOH examination.  HOME CARE INSTRUCTIONS   Ringworm may be treated with a topical antifungal cream, ointment, or oral medications.   If you are using a cream or ointment, wash infected skin. Dry it completely before application.   Scrub the skin with a buff puff or abrasive sponge using a shampoo with ketoconazole to remove dead skin and help treat the ringworm.   Have your pet treated by your veterinarian if it has the same infection.  SEEK MEDICAL CARE IF:   Your ringworm patch (fungus) continues to spread after 7 days of treatment.   Your rash is not gone in 4 weeks. Fungal infections are slow to respond to treatment. Some redness (erythema) may remain for several weeks after the fungus is gone.   The area becomes red, warm, tender, and swollen beyond the patch. This may be a secondary bacterial (germ) infection.   You have a fever.  Document Released: 06/19/2000 Document Revised: 03/04/2011 Document Reviewed: 11/30/2008 [facility] Patient Information 2012 [place], [place].

## 2011-08-24 NOTE — ED Notes (Signed)
Pt started with rash 2 weeks ago. At that time she had switched body wash. She has since stopped using the new body wash and rash has persisted for the past 2 weeks. Itching all over with fine rash to chest, upper arms, back, abd and legs. She has been taking benadryl and using caladryl lotion with no relief. Denies fever.

## 2011-08-25 NOTE — ED Provider Notes (Signed)
History     CSN: [id number]  Arrival date & time 08/24/11  Barry Brunner   First MD Initiated Contact with Patient 08/24/11 2004      Chief Complaint  Patient presents with  . Rash    (Consider location/radiation/quality/duration/timing/severity/associated sxs/prior treatment) HPI Comments: 51 y/o female h/o HTN here c/o rash, pruriginous for 2 weeks, worse in torso and upper arms. Using benadryl with no significant improvement.    Past Medical History  Diagnosis Date  . ANXIETY DEPRESSION   . Other and unspecified hyperlipidemia   . SMOKER   . Unspecified essential hypertension     Past Surgical History  Procedure Date  . Abdominal hysterectomy 1999    fibroids  . Single oopherectomy 1999    Family History  Problem Relation Age of Onset  . Diabetes Mother   . Hypertension Mother   . Hyperlipidemia Mother   . Cancer Father     History  Substance Use Topics  . Smoking status: Current Everyday Smoker -- 1.0 packs/day  . Smokeless tobacco: Not on file  . Alcohol Use: Yes     occas    OB History    Grav Para Term Preterm Abortions TAB SAB Ect Mult Living                  Review of Systems  Constitutional: Negative for fever, chills and appetite change.  HENT: Negative for sore throat and neck pain.   Eyes: Negative for redness and itching.  Respiratory: Negative for cough and shortness of breath.   Gastrointestinal: Negative for nausea and vomiting.  Musculoskeletal: Negative for arthralgias.  Skin: Positive for rash.  Neurological: Negative for headaches.    Allergies  Review of patient's allergies indicates no known allergies.  Home Medications   Current Outpatient Rx  Name Route Sig Dispense Refill  . ASPIRIN EC 81 MG PO TBEC Oral Take 1 tablet (81 mg total) by mouth daily. 150 tablet 2  . LISINOPRIL-HYDROCHLOROTHIAZIDE 20-12.5 MG PO TABS Oral Take 1 tablet by mouth daily. 30 tablet 11  . FLUCONAZOLE 200 MG PO TABS  1 tab po daily for 5 days 5 tablet 0   . HYDROXYZINE HCL 50 MG PO TABS Oral Take 1 tablet (50 mg total) by mouth 3 (three) times daily as needed for itching. 20 tablet 0  . KETOCONAZOLE 2 % EX GEL  Use in affected skin daily for 7-10 days 15 g 1  . SERTRALINE HCL 50 MG PO TABS Oral Take 50 mg by mouth at bedtime.        BP 199/80  Pulse 86  Temp(Src) 98.4 F (36.9 C) (Oral)  Resp 16  SpO2 100%  Physical Exam  Nursing note and vitals reviewed. Constitutional: She is oriented to person, place, and time. She appears well-developed and well-nourished. No distress.  HENT:  Head: Normocephalic and atraumatic.  Mouth/Throat: Oropharynx is clear and moist.  Eyes: Conjunctivae are normal. Pupils are equal, round, and reactive to light.  Neck: Neck supple.  Cardiovascular: Normal heart sounds.   Pulmonary/Chest: Breath sounds normal.  Abdominal: Soft.       No HSM  Lymphadenopathy:    She has no cervical adenopathy.  Neurological: She is alert and oriented to person, place, and time.  Skin:       Macular rash in patches with raised borders and peeling skin in the center pruriginous. No pustules, worse in upper chest and upper back and proximal arms dorsum.    ED Course  Procedures (including critical care time)  Labs Reviewed - No data to display No results found.   1. Tinea corporis       MDM  Favors tinea versicolor vs pityriasis rosea. Treated with selsum blue shampoo, ketoconazol 2% topical, diflucan and vistaril.         Sharin Grave, MD 08/25/11 1118

## 2011-12-14 ENCOUNTER — Encounter (HOSPITAL_COMMUNITY): Payer: Self-pay

## 2011-12-14 ENCOUNTER — Emergency Department (HOSPITAL_COMMUNITY)
Admission: EM | Admit: 2011-12-14 | Discharge: 2011-12-14 | Disposition: A | Discharge status: Home / Self Care | Payer: BC Managed Care – PPO | Attending: Emergency Medicine | Admitting: Emergency Medicine

## 2011-12-14 DIAGNOSIS — F411 Generalized anxiety disorder: Secondary | ICD-10-CM | POA: Insufficient documentation

## 2011-12-14 DIAGNOSIS — R209 Unspecified disturbances of skin sensation: Secondary | ICD-10-CM | POA: Insufficient documentation

## 2011-12-14 NOTE — ED Notes (Signed)
Pt sts she did not want to stay to be seen; pt ambulated out door
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
## 2012-01-09 ENCOUNTER — Inpatient Hospital Stay (HOSPITAL_COMMUNITY)
Admission: EM | Admit: 2012-01-09 | Discharge: 2012-01-10 | DRG: 175 | Disposition: A | Discharge status: Home / Self Care | Payer: BC Managed Care – PPO | Attending: Internal Medicine | Admitting: Internal Medicine

## 2012-01-09 ENCOUNTER — Encounter (HOSPITAL_COMMUNITY)
Admission: EM | Disposition: A | Discharge status: Home / Self Care | Payer: Self-pay | Source: Home / Self Care | Attending: Internal Medicine

## 2012-01-09 ENCOUNTER — Emergency Department (HOSPITAL_COMMUNITY): Payer: BC Managed Care – PPO

## 2012-01-09 ENCOUNTER — Encounter (HOSPITAL_COMMUNITY): Payer: Self-pay | Admitting: Emergency Medicine

## 2012-01-09 ENCOUNTER — Emergency Department (HOSPITAL_COMMUNITY)
Admission: EM | Admit: 2012-01-09 | Discharge: 2012-01-09 | Disposition: A | Discharge status: Home / Self Care | Payer: BC Managed Care – PPO | Attending: Emergency Medicine | Admitting: Emergency Medicine

## 2012-01-09 ENCOUNTER — Emergency Department (HOSPITAL_COMMUNITY)
Admission: EM | Admit: 2012-01-09 | Discharge: 2012-01-09 | Discharge status: Absent without leave | Payer: BC Managed Care – PPO | Source: Home / Self Care

## 2012-01-09 ENCOUNTER — Encounter (HOSPITAL_COMMUNITY): Payer: Self-pay | Admitting: Radiology

## 2012-01-09 DIAGNOSIS — F411 Generalized anxiety disorder: Secondary | ICD-10-CM | POA: Diagnosis present

## 2012-01-09 DIAGNOSIS — I10 Essential (primary) hypertension: Secondary | ICD-10-CM | POA: Insufficient documentation

## 2012-01-09 DIAGNOSIS — Z8249 Family history of ischemic heart disease and other diseases of the circulatory system: Secondary | ICD-10-CM

## 2012-01-09 DIAGNOSIS — K920 Hematemesis: Secondary | ICD-10-CM | POA: Diagnosis present

## 2012-01-09 DIAGNOSIS — F341 Dysthymic disorder: Secondary | ICD-10-CM | POA: Diagnosis present

## 2012-01-09 DIAGNOSIS — IMO0002 Reserved for concepts with insufficient information to code with codable children: Secondary | ICD-10-CM | POA: Insufficient documentation

## 2012-01-09 DIAGNOSIS — S1191XA Laceration without foreign body of unspecified part of neck, initial encounter: Secondary | ICD-10-CM

## 2012-01-09 DIAGNOSIS — K922 Gastrointestinal hemorrhage, unspecified: Principal | ICD-10-CM | POA: Diagnosis present

## 2012-01-09 DIAGNOSIS — F17200 Nicotine dependence, unspecified, uncomplicated: Secondary | ICD-10-CM | POA: Diagnosis present

## 2012-01-09 DIAGNOSIS — R000 Tachycardia, unspecified: Secondary | ICD-10-CM | POA: Insufficient documentation

## 2012-01-09 DIAGNOSIS — K2980 Duodenitis without bleeding: Secondary | ICD-10-CM | POA: Diagnosis present

## 2012-01-09 DIAGNOSIS — R112 Nausea with vomiting, unspecified: Secondary | ICD-10-CM

## 2012-01-09 DIAGNOSIS — S1190XA Unspecified open wound of unspecified part of neck, initial encounter: Secondary | ICD-10-CM

## 2012-01-09 DIAGNOSIS — Z833 Family history of diabetes mellitus: Secondary | ICD-10-CM

## 2012-01-09 DIAGNOSIS — D649 Anemia, unspecified: Secondary | ICD-10-CM | POA: Diagnosis present

## 2012-01-09 DIAGNOSIS — S21109A Unspecified open wound of unspecified front wall of thorax without penetration into thoracic cavity, initial encounter: Secondary | ICD-10-CM | POA: Insufficient documentation

## 2012-01-09 DIAGNOSIS — F329 Major depressive disorder, single episode, unspecified: Secondary | ICD-10-CM | POA: Diagnosis present

## 2012-01-09 DIAGNOSIS — Z7982 Long term (current) use of aspirin: Secondary | ICD-10-CM | POA: Insufficient documentation

## 2012-01-09 DIAGNOSIS — Z79899 Other long term (current) drug therapy: Secondary | ICD-10-CM | POA: Insufficient documentation

## 2012-01-09 DIAGNOSIS — Z90710 Acquired absence of both cervix and uterus: Secondary | ICD-10-CM

## 2012-01-09 DIAGNOSIS — F3289 Other specified depressive episodes: Secondary | ICD-10-CM | POA: Diagnosis present

## 2012-01-09 HISTORY — DX: Essential (primary) hypertension: I10

## 2012-01-09 HISTORY — PX: ESOPHAGOGASTRODUODENOSCOPY: SHX5428

## 2012-01-09 HISTORY — DX: Acquired absence of both cervix and uterus: Z90.710

## 2012-01-09 LAB — TYPE AND SCREEN
ABO/RH(D): B POS
ABO/RH(D): B POS
Antibody Screen: NEGATIVE
Antibody Screen: NEGATIVE
Unit division: 0
Unit division: 0

## 2012-01-09 LAB — RAPID URINE DRUG SCREEN, HOSP PERFORMED
Amphetamines: NOT DETECTED
Barbiturates: NOT DETECTED
Benzodiazepines: NOT DETECTED
Cocaine: NOT DETECTED
Opiates: NOT DETECTED
Tetrahydrocannabinol: NOT DETECTED

## 2012-01-09 LAB — CBC
HCT: 35.8 % — ABNORMAL LOW (ref 36.0–46.0)
HCT: 33.8 % — ABNORMAL LOW (ref 36.0–46.0)
HCT: 32.8 % — ABNORMAL LOW (ref 36.0–46.0)
Hemoglobin: 12.5 g/dL (ref 12.0–15.0)
Hemoglobin: 11.8 g/dL — ABNORMAL LOW (ref 12.0–15.0)
Hemoglobin: 11.6 g/dL — ABNORMAL LOW (ref 12.0–15.0)
MCH: 30.9 pg (ref 26.0–34.0)
MCH: 31.1 pg (ref 26.0–34.0)
MCH: 31.5 pg (ref 26.0–34.0)
MCHC: 34.9 g/dL (ref 30.0–36.0)
MCHC: 34.9 g/dL (ref 30.0–36.0)
MCHC: 35.4 g/dL (ref 30.0–36.0)
MCV: 88.6 fL (ref 78.0–100.0)
MCV: 88.9 fL (ref 78.0–100.0)
MCV: 89.1 fL (ref 78.0–100.0)
Platelets: 293 10*3/uL (ref 150–400)
Platelets: 226 10*3/uL (ref 150–400)
Platelets: 234 10*3/uL (ref 150–400)
RBC: 4.04 MIL/uL (ref 3.87–5.11)
RBC: 3.8 MIL/uL — ABNORMAL LOW (ref 3.87–5.11)
RBC: 3.68 MIL/uL — ABNORMAL LOW (ref 3.87–5.11)
RDW: 13.3 % (ref 11.5–15.5)
RDW: 13.4 % (ref 11.5–15.5)
RDW: 13.5 % (ref 11.5–15.5)
WBC: 7.9 10*3/uL (ref 4.0–10.5)
WBC: 6 10*3/uL (ref 4.0–10.5)
WBC: 8.6 10*3/uL (ref 4.0–10.5)

## 2012-01-09 LAB — POCT I-STAT, CHEM 8
BUN: 10 mg/dL (ref 6–23)
Calcium, Ion: 1.13 mmol/L (ref 1.12–1.23)
Chloride: 106 mEq/L (ref 96–112)
Creatinine, Ser: 1.4 mg/dL — ABNORMAL HIGH (ref 0.50–1.10)
Glucose, Bld: 103 mg/dL — ABNORMAL HIGH (ref 70–99)
HCT: 52 % — ABNORMAL HIGH (ref 36.0–46.0)
Hemoglobin: 17.7 g/dL — ABNORMAL HIGH (ref 12.0–15.0)
Potassium: 3.5 mEq/L (ref 3.5–5.1)
Sodium: 140 mEq/L (ref 135–145)
TCO2: 18 mmol/L (ref 0–100)

## 2012-01-09 LAB — COMPREHENSIVE METABOLIC PANEL
ALT: 18 U/L (ref 0–35)
AST: 21 U/L (ref 0–37)
Albumin: 4.5 g/dL (ref 3.5–5.2)
Alkaline Phosphatase: 58 U/L (ref 39–117)
BUN: 14 mg/dL (ref 6–23)
CO2: 20 mEq/L (ref 19–32)
Calcium: 9.6 mg/dL (ref 8.4–10.5)
Chloride: 97 mEq/L (ref 96–112)
Creatinine, Ser: 0.86 mg/dL (ref 0.50–1.10)
GFR calc Af Amer: 90 mL/min — ABNORMAL LOW (ref >90)
GFR calc non Af Amer: 77 mL/min — ABNORMAL LOW (ref >90)
Glucose, Bld: 142 mg/dL — ABNORMAL HIGH (ref 70–99)
Potassium: 3.1 mEq/L — ABNORMAL LOW (ref 3.5–5.1)
Sodium: 137 mEq/L (ref 135–145)
Total Bilirubin: 0.2 mg/dL — ABNORMAL LOW (ref 0.3–1.2)
Total Protein: 7.8 g/dL (ref 6.0–8.3)

## 2012-01-09 LAB — URINALYSIS, MICROSCOPIC ONLY
Bilirubin Urine: NEGATIVE
Glucose, UA: NEGATIVE mg/dL
Ketones, ur: NEGATIVE mg/dL
Leukocytes, UA: NEGATIVE
Nitrite: NEGATIVE
Protein, ur: NEGATIVE mg/dL
Specific Gravity, Urine: 1.008 (ref 1.005–1.030)
Urobilinogen, UA: 0.2 mg/dL (ref 0.0–1.0)
pH: 6 (ref 5.0–8.0)

## 2012-01-09 LAB — BASIC METABOLIC PANEL
BUN: 12 mg/dL (ref 6–23)
CO2: 21 mEq/L (ref 19–32)
Calcium: 9.2 mg/dL (ref 8.4–10.5)
Chloride: 104 mEq/L (ref 96–112)
Creatinine, Ser: 0.7 mg/dL (ref 0.50–1.10)
GFR calc Af Amer: >90 mL/min (ref >90)
GFR calc non Af Amer: >90 mL/min (ref >90)
Glucose, Bld: 127 mg/dL — ABNORMAL HIGH (ref 70–99)
Potassium: 3.4 mEq/L — ABNORMAL LOW (ref 3.5–5.1)
Sodium: 140 mEq/L (ref 135–145)

## 2012-01-09 LAB — OCCULT BLOOD, POC DEVICE: Fecal Occult Bld: POSITIVE

## 2012-01-09 LAB — ABO/RH: ABO/RH(D): B POS

## 2012-01-09 LAB — ETHANOL: Alcohol, Ethyl (B): 140 mg/dL — ABNORMAL HIGH (ref 0–11)

## 2012-01-09 LAB — LACTIC ACID, PLASMA: Lactic Acid, Venous: 5 mmol/L — ABNORMAL HIGH (ref 0.5–2.2)

## 2012-01-09 LAB — PROTIME-INR
INR: 1.05 (ref 0.00–1.49)
Prothrombin Time: 13.9 seconds (ref 11.6–15.2)

## 2012-01-09 SURGERY — EGD (ESOPHAGOGASTRODUODENOSCOPY)
Anesthesia: Moderate Sedation

## 2012-01-09 MED ORDER — PANTOPRAZOLE SODIUM 40 MG IV SOLR: 80.0000 mg | Freq: Once | INTRAVENOUS | Status: AC

## 2012-01-09 MED ORDER — HYDROCODONE-ACETAMINOPHEN 5-325 MG PO TABS: 1.0000 | ORAL_TABLET | ORAL | Status: DC | PRN

## 2012-01-09 MED ORDER — OXYCODONE-ACETAMINOPHEN 5-325 MG PO TABS
2.0000 | ORAL_TABLET | Freq: Once | ORAL | Status: AC
  Administered 2012-01-09: 2 via ORAL
  Filled 2012-01-09: qty 2

## 2012-01-09 MED ORDER — ALUM & MAG HYDROXIDE-SIMETH 200-200-20 MG/5ML PO SUSP: 30.0000 mL | Freq: Four times a day (QID) | ORAL | Status: DC | PRN

## 2012-01-09 MED ORDER — OXYMETAZOLINE HCL 0.05 % NA SOLN
NASAL | Status: AC
  Administered 2012-01-09: 09:00:00
  Filled 2012-01-09: qty 15

## 2012-01-09 MED ORDER — MIDAZOLAM HCL 10 MG/2ML IJ SOLN
INTRAMUSCULAR | Status: DC | PRN
  Administered 2012-01-09: 1 mg via INTRAVENOUS
  Administered 2012-01-09: 2 mg via INTRAVENOUS
  Administered 2012-01-09 (×2): 1 mg via INTRAVENOUS

## 2012-01-09 MED ORDER — SODIUM CHLORIDE 0.9 % IV SOLN: INTRAVENOUS | Status: DC

## 2012-01-09 MED ORDER — PANTOPRAZOLE SODIUM 40 MG IV SOLR
40.0000 mg | Freq: Once | INTRAVENOUS | Status: DC
  Filled 2012-01-09: qty 40

## 2012-01-09 MED ORDER — SODIUM CHLORIDE 0.9 % IJ SOLN: 3.0000 mL | Freq: Two times a day (BID) | INTRAMUSCULAR | Status: DC

## 2012-01-09 MED ORDER — ACETAMINOPHEN 650 MG RE SUPP: 650.0000 mg | Freq: Four times a day (QID) | RECTAL | Status: DC | PRN

## 2012-01-09 MED ORDER — DIPHENHYDRAMINE HCL 50 MG/ML IJ SOLN
INTRAMUSCULAR | Status: DC | PRN
  Administered 2012-01-09: 25 mg via INTRAVENOUS

## 2012-01-09 MED ORDER — ONDANSETRON HCL 4 MG/2ML IJ SOLN: 4.0000 mg | Freq: Three times a day (TID) | INTRAMUSCULAR | Status: AC | PRN

## 2012-01-09 MED ORDER — SODIUM CHLORIDE 0.9 % IV SOLN
8.0000 mg/h | INTRAVENOUS | Status: DC
  Administered 2012-01-09: 8 mg/h via INTRAVENOUS
  Filled 2012-01-09 (×2): qty 80

## 2012-01-09 MED ORDER — FENTANYL CITRATE 0.05 MG/ML IJ SOLN
INTRAMUSCULAR | Status: AC
  Filled 2012-01-09: qty 2

## 2012-01-09 MED ORDER — FENTANYL CITRATE 0.05 MG/ML IJ SOLN
INTRAMUSCULAR | Status: DC | PRN
  Administered 2012-01-09 (×2): 12.5 ug via INTRAVENOUS
  Administered 2012-01-09: 25 ug via INTRAVENOUS

## 2012-01-09 MED ORDER — MIDAZOLAM HCL 10 MG/2ML IJ SOLN
INTRAMUSCULAR | Status: AC
  Filled 2012-01-09: qty 2

## 2012-01-09 MED ORDER — POTASSIUM CHLORIDE 10 MEQ/100ML IV SOLN
10.0000 meq | Freq: Once | INTRAVENOUS | Status: AC
  Administered 2012-01-09: 10 meq via INTRAVENOUS
  Filled 2012-01-09: qty 100

## 2012-01-09 MED ORDER — PANTOPRAZOLE SODIUM 40 MG IV SOLR
40.0000 mg | Freq: Two times a day (BID) | INTRAVENOUS | Status: DC
  Filled 2012-01-09: qty 40

## 2012-01-09 MED ORDER — TETANUS-DIPHTH-ACELL PERTUSSIS 5-2.5-18.5 LF-MCG/0.5 IM SUSP
0.5000 mL | Freq: Once | INTRAMUSCULAR | Status: AC
  Administered 2012-01-09: 0.5 mL via INTRAMUSCULAR
  Filled 2012-01-09: qty 0.5

## 2012-01-09 MED ORDER — MORPHINE SULFATE 2 MG/ML IJ SOLN: 2.0000 mg | INTRAMUSCULAR | Status: DC | PRN

## 2012-01-09 MED ORDER — OXYCODONE-ACETAMINOPHEN 5-325 MG PO TABS: 2.0000 | ORAL_TABLET | ORAL | Status: AC | PRN

## 2012-01-09 MED ORDER — IOHEXOL 350 MG/ML SOLN
50.0000 mL | Freq: Once | INTRAVENOUS | Status: AC | PRN
  Administered 2012-01-09: 50 mL via INTRAVENOUS

## 2012-01-09 MED ORDER — LIDOCAINE VISCOUS 2 % MT SOLN
OROMUCOSAL | Status: AC
  Administered 2012-01-09: 15 mL
  Filled 2012-01-09: qty 15

## 2012-01-09 MED ORDER — PANTOPRAZOLE SODIUM 40 MG PO TBEC: 40.0000 mg | DELAYED_RELEASE_TABLET | Freq: Every day | ORAL | Status: DC

## 2012-01-09 MED ORDER — PNEUMOCOCCAL VAC POLYVALENT 25 MCG/0.5ML IJ INJ
0.5000 mL | INJECTION | INTRAMUSCULAR | Status: AC
  Administered 2012-01-10: 0.5 mL via INTRAMUSCULAR
  Filled 2012-01-09: qty 0.5

## 2012-01-09 MED ORDER — DIPHENHYDRAMINE HCL 50 MG/ML IJ SOLN
INTRAMUSCULAR | Status: AC
  Filled 2012-01-09: qty 1

## 2012-01-09 MED ORDER — ACETAMINOPHEN 325 MG PO TABS: 650.0000 mg | ORAL_TABLET | Freq: Four times a day (QID) | ORAL | Status: DC | PRN

## 2012-01-09 MED ORDER — SODIUM CHLORIDE 0.9 % IV BOLUS (SEPSIS)
1000.0000 mL | Freq: Once | INTRAVENOUS | Status: AC
  Administered 2012-01-09: 1000 mL via INTRAVENOUS

## 2012-01-09 MED ORDER — ONDANSETRON HCL 4 MG/2ML IJ SOLN
4.0000 mg | Freq: Once | INTRAMUSCULAR | Status: AC
  Administered 2012-01-09: 4 mg via INTRAVENOUS
  Filled 2012-01-09: qty 2

## 2012-01-09 MED ORDER — SODIUM CHLORIDE 0.9 % IV SOLN
80.0000 mg | Freq: Once | INTRAVENOUS | Status: DC
  Administered 2012-01-09: 80 mg via INTRAVENOUS
  Filled 2012-01-09: qty 80

## 2012-01-09 MED ORDER — BUTAMBEN-TETRACAINE-BENZOCAINE 2-2-14 % EX AERO
INHALATION_SPRAY | CUTANEOUS | Status: DC | PRN
  Administered 2012-01-09: 2 via TOPICAL

## 2012-01-09 NOTE — ED Notes (Signed)
PT returned from CT; remains A&Ox3; VSS; no signs of distress.
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
## 2012-01-09 NOTE — ED Notes (Signed)
PT ambulated with a  Steady gait; VSS; A&Ox3; no signs of distress; respirations even and unlabored; skin warm and dry; no questions at this time.

## 2012-01-09 NOTE — ED Notes (Signed)
Wound dressed with bacitracin ointment and 4x4.
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
## 2012-01-09 NOTE — ED Notes (Signed)
MD at bedside. Arneta Cliche Dr.
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
## 2012-01-09 NOTE — ED Notes (Signed)
Patient transported to CT 

## 2012-01-09 NOTE — ED Notes (Signed)
X-ray at bedside
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
## 2012-01-10 LAB — CBC
HCT: 29.7 % — ABNORMAL LOW (ref 36.0–46.0)
Hemoglobin: 10.2 g/dL — ABNORMAL LOW (ref 12.0–15.0)
MCH: 31 pg (ref 26.0–34.0)
MCHC: 34.3 g/dL (ref 30.0–36.0)
MCV: 90.3 fL (ref 78.0–100.0)
Platelets: 195 10*3/uL (ref 150–400)
RBC: 3.29 MIL/uL — ABNORMAL LOW (ref 3.87–5.11)
RDW: 13.7 % (ref 11.5–15.5)
WBC: 6.5 10*3/uL (ref 4.0–10.5)

## 2012-01-10 LAB — CLOTEST (H. PYLORI), BIOPSY: Helicobacter screen: POSITIVE — AB

## 2012-01-10 MED ORDER — OMEPRAZOLE MAGNESIUM 20 MG PO TBEC: 20.0000 mg | DELAYED_RELEASE_TABLET | Freq: Every day | ORAL | Status: DC

## 2012-01-10 NOTE — Discharge Summary (Signed)
Physician Discharge Summary  NAME:Jessica Lara  ZOX:[id number]  DOB: Oct 29, 1960   Admit date: 01/09/2012 Discharge date: 01/10/2012  Admitting Diagnosis: Hematemesis  Discharge Diagnoses:  Principal Problem:  *GI bleed Active Problems:  Laceration of neck  Duodenitis without mention of hemorrhage  ANXIETY DEPRESSION  Unspecified essential hypertension    Discharge Condition: Improved  Hospital Course: The patient was admitted and underwent urgent endoscopy. Findings included a mild degree of duodenitis without overt bleeding and no overt ulcer. Tests for Helicobacter pylori are pending. She was asked to stop aspirin and start proton pump inhibitors (see med list) and was discharged on a regular diet. She never had melena during this illness and had only wanted to small episodes of hematemesis.  Her hemoglobin did fall from 11.6-10.6 she was hemodynamically stable.  Consults: GI  Disposition: 01-Home or Self Care  Discharge Orders    Future Orders Please Complete By Expires   Diet - low sodium heart healthy      Increase activity slowly      Comments:   You may resume work on 01/11/12. Keep lacerated area clean and dry. Call Dr. Debby Bud' office if there is any bleeding or redness around laceration.     Medication List  As of 01/10/2012  9:25 AM   STOP taking these medications         aspirin EC 81 MG tablet         TAKE these medications         lisinopril-hydrochlorothiazide 20-12.5 MG per tablet   Commonly known as: PRINZIDE,ZESTORETIC   Take 1 tablet by mouth daily.      omeprazole 20 MG tablet   Commonly known as: PRILOSEC OTC   Take 1 tablet (20 mg total) by mouth daily.           Follow-up Information    Follow up with Illene Regulus, MD. (Dr. Debby Bud' office will call to set up recheck of hemoglobin on 7/8 or 7/9 and removal of stitches on 7/10)    Contact information:   [address] [place] [address] [phone]          Things  to follow up in the outpatient setting: Followup on hemoglobin in 1-2 days. Removal of sutures from laceration and 3 days.  Time coordinating discharge: 25 minutes including medication reconciliation, preparation of discharge papers, and discussion with patient     Signed: Darnelle Bos 01/10/2012, 9:25 AM

## 2012-01-11 LAB — POCT I-STAT, CHEM 8
BUN: 12 mg/dL (ref 6–23)
Calcium, Ion: 1.13 mmol/L (ref 1.12–1.23)
Chloride: 104 mEq/L (ref 96–112)
Creatinine, Ser: 1 mg/dL (ref 0.50–1.10)
Glucose, Bld: 121 mg/dL — ABNORMAL HIGH (ref 70–99)
HCT: 34 % — ABNORMAL LOW (ref 36.0–46.0)
Hemoglobin: 11.6 g/dL — ABNORMAL LOW (ref 12.0–15.0)
Potassium: 3.1 mEq/L — ABNORMAL LOW (ref 3.5–5.1)
Sodium: 138 mEq/L (ref 135–145)
TCO2: 18 mmol/L (ref 0–100)
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
## 2012-01-12 ENCOUNTER — Encounter (HOSPITAL_COMMUNITY): Payer: Self-pay | Admitting: Internal Medicine

## 2012-01-12 ENCOUNTER — Encounter (HOSPITAL_COMMUNITY): Payer: Self-pay | Admitting: Emergency Medicine

## 2012-01-14 ENCOUNTER — Other Ambulatory Visit (INDEPENDENT_AMBULATORY_CARE_PROVIDER_SITE_OTHER): Payer: BC Managed Care – PPO

## 2012-01-14 ENCOUNTER — Encounter: Payer: Self-pay | Admitting: Internal Medicine

## 2012-01-14 ENCOUNTER — Encounter (HOSPITAL_COMMUNITY): Payer: Self-pay | Admitting: Emergency Medicine

## 2012-01-14 ENCOUNTER — Ambulatory Visit (INDEPENDENT_AMBULATORY_CARE_PROVIDER_SITE_OTHER): Payer: BC Managed Care – PPO | Admitting: Internal Medicine

## 2012-01-14 VITALS — BP 118/80 | HR 72 | Temp 98.9°F | Resp 16 | Wt 137.0 lb

## 2012-01-14 DIAGNOSIS — K922 Gastrointestinal hemorrhage, unspecified: Secondary | ICD-10-CM

## 2012-01-14 DIAGNOSIS — Z4802 Encounter for removal of sutures: Secondary | ICD-10-CM

## 2012-01-14 DIAGNOSIS — K2980 Duodenitis without bleeding: Secondary | ICD-10-CM

## 2012-01-14 LAB — HEMOGLOBIN AND HEMATOCRIT, BLOOD
HCT: 37.3 % (ref 36.0–46.0)
Hemoglobin: 12.6 g/dL (ref 12.0–15.0)
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
## 2012-01-17 NOTE — Assessment & Plan Note (Signed)
Patient without any signs of further bleeding. She has stopped aspirin and has been taking omeprazole.  Plan Follow-up Hgb  Addendum -  Lab Results  Component Value Date   HGB 12.6 01/14/2012   Stable with return to normal Hgb.

## 2012-01-19 ENCOUNTER — Telehealth: Payer: Self-pay | Admitting: *Deleted

## 2012-01-19 NOTE — Telephone Encounter (Signed)
PATIENT NOTIFIED OF NORMAL LAB WORK

## 2012-01-19 NOTE — Telephone Encounter (Signed)
Message copied by Elnora Morrison on Tue Jan 19, 2012  3:10 PM ------      Message from: Jacques Navy      Created: Sun Jan 17, 2012  4:33 AM       Please call patient - blood counts are normal. thanks

## 2012-05-21 ENCOUNTER — Other Ambulatory Visit: Payer: Self-pay | Admitting: Internal Medicine

## 2012-05-23 ENCOUNTER — Other Ambulatory Visit: Payer: Self-pay | Admitting: Internal Medicine

## 2012-05-25 ENCOUNTER — Other Ambulatory Visit: Payer: Self-pay | Admitting: Internal Medicine

## 2012-09-01 ENCOUNTER — Encounter (HOSPITAL_COMMUNITY): Payer: Self-pay | Admitting: Emergency Medicine

## 2012-09-01 ENCOUNTER — Emergency Department (INDEPENDENT_AMBULATORY_CARE_PROVIDER_SITE_OTHER)
Admission: EM | Admit: 2012-09-01 | Discharge: 2012-09-01 | Disposition: A | Discharge status: Home / Self Care | Payer: BC Managed Care – PPO | Source: Home / Self Care | Attending: Emergency Medicine | Admitting: Emergency Medicine

## 2012-09-01 ENCOUNTER — Emergency Department (INDEPENDENT_AMBULATORY_CARE_PROVIDER_SITE_OTHER): Payer: BC Managed Care – PPO

## 2012-09-01 DIAGNOSIS — S239XXA Sprain of unspecified parts of thorax, initial encounter: Secondary | ICD-10-CM

## 2012-09-01 DIAGNOSIS — S29019A Strain of muscle and tendon of unspecified wall of thorax, initial encounter: Secondary | ICD-10-CM

## 2012-09-01 MED ORDER — HYDROCODONE-ACETAMINOPHEN 5-325 MG PO TABS: ORAL_TABLET | ORAL | Status: DC

## 2012-09-01 MED ORDER — DICLOFENAC SODIUM 75 MG PO TBEC: 75.0000 mg | DELAYED_RELEASE_TABLET | Freq: Two times a day (BID) | ORAL | Status: DC

## 2012-09-01 MED ORDER — CYCLOBENZAPRINE HCL 5 MG PO TABS: 5.0000 mg | ORAL_TABLET | Freq: Three times a day (TID) | ORAL | Status: DC | PRN

## 2012-09-01 NOTE — ED Notes (Signed)
Pt c/o upper back/ shoulder blade pain that is sharp and radiates up through neck. Pt denies injury, but states"i do a lot of heavy lifting at work' Symptoms present for 3 months now but is gradually gotten worse.  Pt has used bengay and ibuprofen with no relief of symptoms.

## 2012-09-01 NOTE — ED Provider Notes (Signed)
Chief Complaint  Patient presents with  . Back Pain    upper back/shoulder blade pain    History of Present Illness:   Jessica Lara is a 52 year old female who has had a three-month history of pain in her upper back radiating into her neck. The pain is bilateral, worse on the right than the left. It extends from her CVA area bilaterally up to the base of her skull. It also extends to both shoulders. It feels like it's a burning, like she is "on fire". She denies any pain in her lower back or chest. It hurts her to move her neck and her shoulders. Also hurts to lift or to to stand. She's tried ibuprofen and Tylenol as well as BenGay without any relief. She works at [employer] and [employer]. She states she does a lot of lifting. She denies any radiation down her arms, numbness, tingling, or muscle weakness. She has no anterior chest pain or shortness of breath. She is a cigarette smoker. She has high blood pressure and takes lisinopril/HCTZ for that.  Review of Systems:  Other than noted above, the patient denies any of the following symptoms: Constitutional:  No fever, chills, or sweats. ENT:  No nasal congestion, sore throat, or oral ulcerations or lesions. Neck:  No swelling, or adenopathy.  Full ROM without pain. Cardiac:  No chest pain, tightness, or pressure. Respiratory:  No cough, wheezing, or dyspnea. M-S:  No joint pain, muscle pain, or back problems. Neuro:  No muscle weakness, numbness or paresthesias.  PMFSH:  Past medical history, family history, social history, meds, and allergies were reviewed.  Physical Exam:   Vital signs:  BP 154/79  Pulse 62  Temp(Src) 98.2 F (36.8 C) (Oral)  Resp 20  SpO2 100% General:  Alert, oriented and in no distress. Eye:  PERRL, full EOMs. ENT:  Pharynx clear, no oral lesions. Neck:  There is diffuse pain to palpation from the base of her skull on down both trapezius ridges to her shoulders, upper back as far as  the CVA area. There is no pain in the anterior chest or the lower back. Her neck has a limited range of motion with pain. In both shoulders have limited abduction with pain. Lungs:  No respiratory distress.  Breath sounds clear and equal bilaterally.  No wheezes, rales or rhonchi. Heart:  Regular rhythm.  No gallops, murmers, or rubs. Ext:  No upper extremity edema, pulses full.  Full ROM of joints with no joint or muscle pain to palpation. Neuro:  Alert and oriented times 3.  No focal muscle weakness.  DTRs symmetric.  Sensation intact to light touch. Skin: Clear, warm and dry.  No rash.  Good capillary refill.  Radiology:  Dg Chest 2 View  09/01/2012  *RADIOLOGY REPORT*  Clinical Data: Pain  CHEST - 2 VIEW  Comparison: June 16, 2010  Findings:  Lungs clear.  Heart size and pulmonary vascularity are normal.  No adenopathy.  No bone lesions.  No pneumothorax.  IMPRESSION: No abnormality noted.   Original Report Authenticated By: Bretta Bang, M.D.     Assessment:  The encounter diagnosis was Strain of thoracic region, initial encounter.  The differential diagnosis includes: Cervical strain, degenerative disc disease of the cervical spine, osteoarthritis of the cervical spine, thoracic strain, degenerative disc disease or osteoarthritis of the thoracic spine, rotator cuff tendinitis of the shoulders, or fibromyalgia. She'll need further workup, and I suspect this should include MRIs of  her neck and upper back. I referred her to an orthopedist for further evaluation.  Plan:   1.  The following meds were prescribed:   Discharge Medication List as of 09/01/2012  7:56 PM    START taking these medications   Details  cyclobenzaprine (FLEXERIL) 5 MG tablet Take 1 tablet (5 mg total) by mouth 3 (three) times daily as needed for muscle spasms., Starting 09/01/2012, Until Discontinued, Normal    diclofenac (VOLTAREN) 75 MG EC tablet Take 1 tablet (75 mg total) by mouth 2 (two) times daily.,  Starting 09/01/2012, Until Discontinued, Normal    HYDROcodone-acetaminophen (NORCO/VICODIN) 5-325 MG per tablet 1 to 2 tabs every 4 to 6 hours as needed for pain., Print       2.  The patient was instructed in symptomatic care and handouts were given. She was given a note to remain out of work for the next 3 days. 3.  The patient was told to return if becoming worse in any way, if no better in 3 or 4 days, and given some red flag symptoms such as numbness, tingling, or weakness in her upper extremities, fever, or anterior chest pain or shortness of breath that would indicate earlier return.    Reuben Likes, MD 09/01/12 2024

## 2012-11-19 ENCOUNTER — Other Ambulatory Visit: Payer: Self-pay | Admitting: Internal Medicine

## 2012-11-22 ENCOUNTER — Other Ambulatory Visit: Payer: Self-pay | Admitting: Internal Medicine

## 2012-11-24 ENCOUNTER — Other Ambulatory Visit: Payer: Self-pay | Admitting: Internal Medicine

## 2013-06-20 ENCOUNTER — Other Ambulatory Visit: Payer: Self-pay | Admitting: Internal Medicine

## 2013-07-24 ENCOUNTER — Other Ambulatory Visit: Payer: Self-pay | Admitting: Internal Medicine

## 2013-08-11 ENCOUNTER — Emergency Department (INDEPENDENT_AMBULATORY_CARE_PROVIDER_SITE_OTHER)
Admission: EM | Admit: 2013-08-11 | Discharge: 2013-08-11 | Disposition: A | Discharge status: Home / Self Care | Payer: BC Managed Care – PPO | Source: Home / Self Care | Attending: Family Medicine | Admitting: Family Medicine

## 2013-08-11 ENCOUNTER — Encounter (HOSPITAL_COMMUNITY): Payer: Self-pay | Admitting: Emergency Medicine

## 2013-08-11 DIAGNOSIS — S239XXA Sprain of unspecified parts of thorax, initial encounter: Secondary | ICD-10-CM

## 2013-08-11 MED ORDER — KETOROLAC TROMETHAMINE 10 MG PO TABS: 10.0000 mg | ORAL_TABLET | Freq: Four times a day (QID) | ORAL | Status: DC | PRN

## 2013-08-11 MED ORDER — CYCLOBENZAPRINE HCL 5 MG PO TABS: 5.0000 mg | ORAL_TABLET | Freq: Three times a day (TID) | ORAL | Status: DC | PRN

## 2013-08-11 MED ORDER — KETOROLAC TROMETHAMINE 30 MG/ML IJ SOLN
30.0000 mg | Freq: Once | INTRAMUSCULAR | Status: AC
  Administered 2013-08-11: 30 mg via INTRAMUSCULAR

## 2013-08-11 MED ORDER — KETOROLAC TROMETHAMINE 30 MG/ML IJ SOLN
INTRAMUSCULAR | Status: AC
  Filled 2013-08-11: qty 1

## 2013-08-11 NOTE — ED Provider Notes (Signed)
CSN: [id number]     Arrival date & time 08/11/13  0804 History   First MD Initiated Contact with Patient 08/11/13 [phone]     Chief Complaint  Patient presents with  . Back Pain   (Consider location/radiation/quality/duration/timing/severity/associated sxs/prior Treatment) Patient is a 53 y.o. female presenting with back pain. The history is provided by the patient.  Back Pain Location:  Thoracic spine Quality:  Burning and stiffness Radiates to:  Does not radiate Pain severity:  Moderate Onset quality:  Sudden Progression:  Unchanged Chronicity:  Recurrent Context: lifting heavy objects   Associated symptoms: no chest pain, no dysuria, no numbness, no paresthesias, no pelvic pain, no perianal numbness and no tingling     Past Medical History  Diagnosis Date  . H/O: hysterectomy   . Hypertension   . Other and unspecified hyperlipidemia   . Unspecified essential hypertension   . SMOKER   . ANXIETY DEPRESSION    Past Surgical History  Procedure Laterality Date  . Abdominal hysterectomy  1999    fibroids  . Single oopherectomy  1999  . Esophagogastroduodenoscopy  01/09/2012    Procedure: ESOPHAGOGASTRODUODENOSCOPY (EGD);  Surgeon: Irene Shipper, MD;  Location: [facility] ENDOSCOPY;  Service: Endoscopy;  Laterality: N/A;   Family History  Problem Relation Age of Onset  . Diabetes Mother   . Hypertension Mother   . Hyperlipidemia Mother   . Cancer Father    History  Substance Use Topics  . Smoking status: Current Every Day Smoker -- 1.00 packs/day    Types: Cigarettes  . Smokeless tobacco: Not on file  . Alcohol Use: Yes     Comment: occas   OB History   Grav Para Term Preterm Abortions TAB SAB Ect Mult Living                 Review of Systems  Constitutional: Negative.   Cardiovascular: Negative for chest pain.  Gastrointestinal: Negative.   Genitourinary: Negative for dysuria, flank pain and pelvic pain.  Musculoskeletal: Positive for back pain, neck pain and neck  stiffness. Negative for gait problem.  Skin: Negative.   Neurological: Negative for tingling, numbness and paresthesias.    Allergies  Review of patient's allergies indicates no known allergies.  Home Medications   Current Outpatient Rx  Name  Route  Sig  Dispense  Refill  . aspirin 81 MG chewable tablet   Oral   Chew 81 mg by mouth daily.         . cyclobenzaprine (FLEXERIL) 5 MG tablet   Oral   Take 1 tablet (5 mg total) by mouth 3 (three) times daily as needed for muscle spasms.   30 tablet   0   . cyclobenzaprine (FLEXERIL) 5 MG tablet   Oral   Take 1 tablet (5 mg total) by mouth 3 (three) times daily as needed for muscle spasms.   30 tablet   0   . diclofenac (VOLTAREN) 75 MG EC tablet   Oral   Take 1 tablet (75 mg total) by mouth 2 (two) times daily.   20 tablet   0   . HYDROcodone-acetaminophen (NORCO/VICODIN) 5-325 MG per tablet      1 to 2 tabs every 4 to 6 hours as needed for pain.   20 tablet   0   . ketorolac (TORADOL) 10 MG tablet   Oral   Take 1 tablet (10 mg total) by mouth every 6 (six) hours as needed. For pain   20 tablet  0   . lisinopril-hydrochlorothiazide (PRINZIDE,ZESTORETIC) 20-12.5 MG per tablet      TAKE 1 TABLET BY MOUTH DAILY.   30 tablet   5   . lisinopril-hydrochlorothiazide (PRINZIDE,ZESTORETIC) 20-12.5 MG per tablet      TAKE 1 TABLET BY MOUTH DAILY.   30 tablet   11   . lisinopril-hydrochlorothiazide (PRINZIDE,ZESTORETIC) 20-12.5 MG per tablet      TAKE 1 TABLET BY MOUTH DAILY.   30 tablet   2     PATIENT IS DUE FOR AN OFFICE VISIT   . lisinopril-hydrochlorothiazide (PRINZIDE,ZESTORETIC) 20-12.5 MG per tablet      TAKE 1 TABLET BY MOUTH DAILY.   30 tablet   0     PLEASE CALL TO SCHEDULE AN APPT   . EXPIRED: omeprazole (PRILOSEC OTC) 20 MG tablet   Oral   Take 1 tablet (20 mg total) by mouth daily.          BP 156/64  Pulse 73  Temp(Src) 98.2 F (36.8 C) (Oral)  Resp 16  SpO2 100% Physical Exam   Nursing note and vitals reviewed. Constitutional: She is oriented to person, place, and time. She appears well-developed and well-nourished. No distress.  Neck: Trachea normal. Muscular tenderness present. No spinous process tenderness present. No rigidity. Decreased range of motion present. No Brudzinski's sign and no Kernig's sign noted.  Musculoskeletal:       Cervical back: She exhibits decreased range of motion, tenderness and spasm. She exhibits no swelling and normal pulse.  Lymphadenopathy:    She has no cervical adenopathy.  Neurological: She is alert and oriented to person, place, and time.  Skin: Skin is warm and dry.    ED Course  Procedures (including critical care time) Labs Review Labs Reviewed - No data to display Imaging Review No results found.    MDM      Billy Fischer, MD 08/11/13 [phone]

## 2013-08-11 NOTE — ED Notes (Signed)
C/o back pain and neck pain States she has had these sx for a while but has flared up with in the last two weeks States she used ibuprofen when helps with the pain but doesn't touch the burning feeling.

## 2013-08-21 ENCOUNTER — Other Ambulatory Visit: Payer: Self-pay | Admitting: Internal Medicine

## 2013-08-22 ENCOUNTER — Ambulatory Visit: Payer: BC Managed Care – PPO | Admitting: Internal Medicine

## 2013-08-24 ENCOUNTER — Other Ambulatory Visit (INDEPENDENT_AMBULATORY_CARE_PROVIDER_SITE_OTHER): Payer: BC Managed Care – PPO

## 2013-08-24 ENCOUNTER — Ambulatory Visit (INDEPENDENT_AMBULATORY_CARE_PROVIDER_SITE_OTHER): Payer: BC Managed Care – PPO | Admitting: Internal Medicine

## 2013-08-24 ENCOUNTER — Ambulatory Visit: Payer: BC Managed Care – PPO | Admitting: Internal Medicine

## 2013-08-24 ENCOUNTER — Encounter: Payer: Self-pay | Admitting: Internal Medicine

## 2013-08-24 VITALS — BP 146/60 | HR 82 | Temp 98.1°F | Wt 137.0 lb

## 2013-08-24 DIAGNOSIS — E785 Hyperlipidemia, unspecified: Secondary | ICD-10-CM

## 2013-08-24 DIAGNOSIS — K2980 Duodenitis without bleeding: Secondary | ICD-10-CM

## 2013-08-24 DIAGNOSIS — Z0000 Encounter for general adult medical examination without abnormal findings: Secondary | ICD-10-CM

## 2013-08-24 DIAGNOSIS — I10 Essential (primary) hypertension: Secondary | ICD-10-CM

## 2013-08-24 DIAGNOSIS — F17200 Nicotine dependence, unspecified, uncomplicated: Secondary | ICD-10-CM

## 2013-08-24 DIAGNOSIS — F341 Dysthymic disorder: Secondary | ICD-10-CM

## 2013-08-24 DIAGNOSIS — Z1239 Encounter for other screening for malignant neoplasm of breast: Secondary | ICD-10-CM

## 2013-08-24 DIAGNOSIS — Z1211 Encounter for screening for malignant neoplasm of colon: Secondary | ICD-10-CM

## 2013-08-24 LAB — BASIC METABOLIC PANEL
BUN: 10 mg/dL (ref 6–23)
CO2: 26 mEq/L (ref 19–32)
Calcium: 9.6 mg/dL (ref 8.4–10.5)
Chloride: 108 mEq/L (ref 96–112)
Creatinine, Ser: 0.7 mg/dL (ref 0.4–1.2)
GFR: 109.24 mL/min (ref >60.00)
Glucose, Bld: 92 mg/dL (ref 70–99)
Potassium: 4 mEq/L (ref 3.5–5.1)
Sodium: 141 mEq/L (ref 135–145)

## 2013-08-24 LAB — LIPID PANEL
Cholesterol: 173 mg/dL (ref 0–200)
HDL: 42 mg/dL (ref >39.00)
Total CHOL/HDL Ratio: 4
Triglycerides: 359 mg/dL — ABNORMAL HIGH (ref 0.0–149.0)
VLDL: 71.8 mg/dL — ABNORMAL HIGH (ref 0.0–40.0)

## 2013-08-24 LAB — TSH: TSH: 1.39 u[IU]/mL (ref 0.35–5.50)

## 2013-08-24 LAB — LDL CHOLESTEROL, DIRECT: Direct LDL: 101.4 mg/dL

## 2013-08-24 MED ORDER — LISINOPRIL-HYDROCHLOROTHIAZIDE 20-12.5 MG PO TABS: ORAL_TABLET | ORAL | Status: DC

## 2013-08-24 NOTE — Assessment & Plan Note (Addendum)
Interval history - unremarkable. Physical exam, sans breast and pelvic, was normal. Due for routine lab: Check Lipid Panel and TSH (last 2012) Mammogram scheduled today - deferred breast to mammography No pap smear - s/p hysterectomy. Will be due a vulvo-vaginal exam at her next complete physical Colonoscopy in 2002. Will schedule today.  Immunizations are up to date.

## 2013-08-24 NOTE — Progress Notes (Signed)
Subjective:     Patient ID: Jessica Lara, female   DOB: [birth year], 53 y.o.   MRN: [id number]  HPI  Interval history significant for a trip to urgent care two weeks ago for a muscle spasm in back and neck. She was seen at [facility] Ortho: had repeat neck films for DDD. Declined MRI or intervention.Pt's job requires a lot of heavy lifting.  No hospitalizations or illness.  Past Medical History  Diagnosis Date  . H/O: hysterectomy   . Hypertension   . Other and unspecified hyperlipidemia   . Unspecified essential hypertension   . SMOKER   . ANXIETY DEPRESSION    Past Surgical History  Procedure Laterality Date  . Abdominal hysterectomy  1999    fibroids  . Single oopherectomy  1999  . Esophagogastroduodenoscopy  01/09/2012    Procedure: ESOPHAGOGASTRODUODENOSCOPY (EGD);  Surgeon: Irene Shipper, MD;  Location: [facility] ENDOSCOPY;  Service: Endoscopy;  Laterality: N/A;   Family History  Problem Relation Age of Onset  . Diabetes Mother   . Hypertension Mother   . Hyperlipidemia Mother   . Cancer Father    History   Social History  . Marital Status: Single    Spouse Name: N/A    Number of Children: N/A  . Years of Education: N/A   Occupational History  . Not on file.   Social History Main Topics  . Smoking status: Current Every Day Smoker -- 1.00 packs/day    Types: Cigarettes  . Smokeless tobacco: Not on file  . Alcohol Use: Yes     Comment: occas  . Drug Use: No  . Sexual Activity: Yes    Birth Control/ Protection: None   Other Topics Concern  . Not on file   Social History Narrative   ** Merged History Encounter **       ** Merged History Encounter **         Review of Systems Constitutional:  Negative for fever, chills, activity change and unexpected weight change.  HEENT:  Negative for hearing loss, ear pain, congestion, neck stiffness and postnasal drip. Negative for sore throat or swallowing problems.  Eyes: Negative for vision loss or change in visual acuity.   Respiratory: Negative for chest tightness and wheezing. Negative for DOE.   Cardiovascular: Negative for chest pain, positive for palpitations. No decreased exercise tolerance Gastrointestinal: No change in bowel habit. No bloating or gas. No reflux or indigestion Genitourinary: Negative for urgency, frequency, flank pain and difficulty urinating.  Musculoskeletal: Neck and back pain from lifting. Negative for other myalgias, back pain, arthralgias and gait problem.  Neurological: Negative for dizziness, tremors, weakness and headaches.  Hematological: Negative for adenopathy.  Psychiatric/Behavioral: Negative for behavioral problems and dysphoric mood.       Objective:   Physical Exam Filed Vitals:   08/24/13 1112  BP: 146/60  Pulse: 82  Temp: 98.1 F (36.7 C)    Wt Readings from Last 3 Encounters:  08/24/13 137 lb (62.143 kg)  01/14/12 137 lb (62.143 kg)  01/09/12 137 lb (62.143 kg)   Body mass index is 22.12 kg/(m^2).  General: Well developed, well nourished, NAD, appears stated age HEENT: NCAT, PERRLA, EOMI, Anicteic Sclera, mucous membranes moist.  Neck: Supple, no JVD, no masses  Cardiovascular: S1 S2 auscultated, no rubs, murmurs or gallops. Regular rate and rhythm.  Respiratory: Clear to auscultation bilaterally with equal chest rise  Abdomen: Soft, nontender, nondistended, + bowel sounds  Extremities: warm, dry without cyanosis, clubbing, or edema  Neuro: Alert and Oriented x3, cranial nerves II-XII grossly intact.  Skin: Without rashes, exudates, or nodules  Psych: Normal mood and affect with intact judgement and insight  Current Outpatient Prescriptions on File Prior to Visit  Medication Sig Dispense Refill  . aspirin 81 MG chewable tablet Chew 81 mg by mouth daily.      . cyclobenzaprine (FLEXERIL) 5 MG tablet Take 1 tablet (5 mg total) by mouth 3 (three) times daily as needed for muscle spasms.  30 tablet  0  . diclofenac (VOLTAREN) 75 MG EC tablet Take 1  tablet (75 mg total) by mouth 2 (two) times daily.  20 tablet  0  . HYDROcodone-acetaminophen (NORCO/VICODIN) 5-325 MG per tablet 1 to 2 tabs every 4 to 6 hours as needed for pain.  20 tablet  0  . ketorolac (TORADOL) 10 MG tablet Take 1 tablet (10 mg total) by mouth every 6 (six) hours as needed. For pain  20 tablet  0  . omeprazole (PRILOSEC OTC) 20 MG tablet Take 1 tablet (20 mg total) by mouth daily.       No current facility-administered medications on file prior to visit.       Assessment and Plan:

## 2013-08-24 NOTE — Assessment & Plan Note (Addendum)
Smoking cessation discussed today. Patient is precontemplative. Counseling given. Smoke journal described. 1800QUITNOW given. Will consider and follow up.

## 2013-08-24 NOTE — Patient Instructions (Addendum)
It has been a pleasure providing care for you today.  1) Health maintenance  - No pap smears needed because of hysterectomy for fibroids - Mammography: We will schedule today - Colorectal cancer screening: We will schedule a colonoscopy today. This involves two visits. One with nursing staff where they tell you about prep process and one for the procedure itself.  -   2) Blood pressure - Your blood pressure is in better control - Continue your current medications  3) Smoking Cessatoin - Quitting smoking has many health benefits - Keep a journal of "1", "2," and "3" cigarettes. Develop habits to stop your hardest smoking cravings.    Smoking Cessation, Tips for Success If you are ready to quit smoking, congratulations! You have chosen to help yourself be healthier. Cigarettes bring nicotine, tar, carbon monoxide, and other irritants into your body. Your lungs, heart, and blood vessels will be able to work better without these poisons. There are many different ways to quit smoking. Nicotine gum, nicotine patches, a nicotine inhaler, or nicotine nasal spray can help with physical craving. Hypnosis, support groups, and medicines help break the habit of smoking. WHAT THINGS CAN I DO TO MAKE QUITTING EASIER?  Here are some tips to help you quit for good:  Pick a date when you will quit smoking completely. Tell all of your friends and family about your plan to quit on that date.  Do not try to slowly cut down on the number of cigarettes you are smoking. Pick a quit date and quit smoking completely starting on that day.  Throw away all cigarettes.   Clean and remove all ashtrays from your home, work, and car.   On a card, write down your reasons for quitting. Carry the card with you and read it when you get the urge to smoke.   Cleanse your body of nicotine. Drink enough water and fluids to keep your urine clear or pale yellow. Do this after quitting to flush the nicotine from your body.    Learn to predict your moods. Do not let a bad situation be your excuse to have a cigarette. Some situations in your life might tempt you into wanting a cigarette.   Never have "just one" cigarette. It leads to wanting another and another. Remind yourself of your decision to quit.   Change habits associated with smoking. If you smoked while driving or when feeling stressed, try other activities to replace smoking. Stand up when drinking your coffee. Brush your teeth after eating. Sit in a different chair when you read the paper. Avoid alcohol while trying to quit, and try to drink fewer caffeinated beverages. Alcohol and caffeine may urge you to smoke.   Avoid foods and drinks that can trigger a desire to smoke, such as sugary or spicy foods and alcohol.   Ask people who smoke not to smoke around you.   Have something planned to do right after eating or having a cup of coffee. For example, plan to take a walk or exercise.   Try a relaxation exercise to calm you down and decrease your stress. Remember, you may be tense and nervous for the first 2 weeks after you quit, but this will pass.   Find new activities to keep your hands busy. Play with a pen, coin, or rubber band. Doodle or draw things on paper.   Brush your teeth right after eating. This will help cut down on the craving for the taste of tobacco after meals.  You can also try mouthwash.   Use oral substitutes in place of cigarettes. Try using lemon drops, carrots, cinnamon sticks, or chewing gum. Keep them handy so they are available when you have the urge to smoke.   When you have the urge to smoke, try deep breathing.   Designate your home as a nonsmoking area.   If you are a heavy smoker, ask your health care provider about a prescription for nicotine chewing gum. It can ease your withdrawal from nicotine.   Reward yourself. Set aside the cigarette money you save and buy yourself something nice.   Look for  support from others. Join a support group or smoking cessation program. Ask someone at home or at work to help you with your plan to quit smoking.   Always ask yourself, "Do I need this cigarette or is this just a reflex?" Tell yourself, "Today, I choose not to smoke," or "I do not want to smoke." You are reminding yourself of your decision to quit.  Do not replace cigarette smoking with electronic cigarettes (commonly called e-cigarettes). The safety of e-cigarettes is unknown, and some may contain harmful chemicals.  If you relapse, do not give up! Plan ahead and think about what you will do the next time you get the urge to smoke.  HOW WILL I FEEL WHEN I QUIT SMOKING? You may have symptoms of withdrawal because your body is used to nicotine (the addictive substance in cigarettes). You may crave cigarettes, be irritable, feel very hungry, cough often, get headaches, or have difficulty concentrating. The withdrawal symptoms are only temporary. They are strongest when you first quit but will go away within 10 14 days. When withdrawal symptoms occur, stay in control. Think about your reasons for quitting. Remind yourself that these are signs that your body is healing and getting used to being without cigarettes. Remember that withdrawal symptoms are easier to treat than the major diseases that smoking can cause.  Even after the withdrawal is over, expect periodic urges to smoke. However, these cravings are generally short lived and will go away whether you smoke or not. Do not smoke!  WHAT RESOURCES ARE AVAILABLE TO HELP ME QUIT SMOKING? Your health care provider can direct you to community resources or hospitals for support, which may include:  Group support.  Education.  Hypnosis.  Therapy. Document Released: 03/20/2004 Document Revised: 04/12/2013 Document Reviewed: 12/08/2012 [facility] Patient Information 2014 [place], [place].

## 2013-08-24 NOTE — Assessment & Plan Note (Signed)
Jessica Lara reports that her mood is "OK." Not on medications.

## 2013-08-24 NOTE — Assessment & Plan Note (Addendum)
BP Readings from Last 3 Encounters:  08/24/13 146/60  08/11/13 156/64  09/01/12 154/79   Patient's blood pressure is in better control.   Plan Continue lisinopril-HCTZ  Check BMET today

## 2013-08-24 NOTE — Assessment & Plan Note (Signed)
H/O elevated cholesterol. Not on medications. Slender body habitus.  Plan Lipid panel with recommendations to follow.

## 2013-08-24 NOTE — Progress Notes (Signed)
Pre visit review using our clinic review tool, if applicable. No additional management support is needed unless otherwise documented below in the visit note. 

## 2013-08-24 NOTE — Assessment & Plan Note (Signed)
Stable  With no complaints of dysphagia, GERD or other GI compliants

## 2013-08-25 ENCOUNTER — Encounter: Payer: Self-pay | Admitting: Internal Medicine

## 2013-08-27 ENCOUNTER — Encounter: Payer: Self-pay | Admitting: Internal Medicine

## 2013-09-12 ENCOUNTER — Other Ambulatory Visit: Payer: Self-pay | Admitting: Internal Medicine

## 2013-09-12 ENCOUNTER — Ambulatory Visit
Admission: RE | Admit: 2013-09-12 | Discharge: 2013-09-12 | Disposition: A | Discharge status: Home / Self Care | Payer: BC Managed Care – PPO | Source: Ambulatory Visit | Attending: Internal Medicine | Admitting: Internal Medicine

## 2013-09-12 DIAGNOSIS — Z1231 Encounter for screening mammogram for malignant neoplasm of breast: Secondary | ICD-10-CM

## 2013-10-10 ENCOUNTER — Ambulatory Visit (AMBULATORY_SURGERY_CENTER): Payer: Self-pay | Admitting: *Deleted

## 2013-10-10 VITALS — Ht 68.0 in | Wt 133.0 lb

## 2013-10-10 DIAGNOSIS — Z1211 Encounter for screening for malignant neoplasm of colon: Secondary | ICD-10-CM

## 2013-10-10 MED ORDER — MOVIPREP 100 G PO SOLR: 1.0000 | Freq: Once | ORAL | Status: DC

## 2013-10-10 NOTE — Progress Notes (Signed)
Denies allergies to eggs or soy products. Denies complications with sedation or anesthesia. 

## 2013-10-13 ENCOUNTER — Encounter: Payer: Self-pay | Admitting: Internal Medicine

## 2013-10-19 ENCOUNTER — Other Ambulatory Visit: Payer: Self-pay | Admitting: *Deleted

## 2013-10-19 MED ORDER — LISINOPRIL-HYDROCHLOROTHIAZIDE 20-12.5 MG PO TABS: ORAL_TABLET | ORAL | Status: DC

## 2013-10-24 ENCOUNTER — Encounter: Payer: BC Managed Care – PPO | Admitting: Internal Medicine

## 2013-11-16 ENCOUNTER — Encounter: Payer: BC Managed Care – PPO | Admitting: Internal Medicine

## 2013-11-20 ENCOUNTER — Encounter: Payer: Self-pay | Admitting: Internal Medicine

## 2013-11-20 ENCOUNTER — Ambulatory Visit (AMBULATORY_SURGERY_CENTER): Payer: BC Managed Care – PPO | Admitting: Internal Medicine

## 2013-11-20 VITALS — BP 147/71 | HR 49 | Temp 97.7°F | Resp 0 | Ht 68.0 in | Wt 133.0 lb

## 2013-11-20 DIAGNOSIS — D126 Benign neoplasm of colon, unspecified: Secondary | ICD-10-CM

## 2013-11-20 DIAGNOSIS — Z1211 Encounter for screening for malignant neoplasm of colon: Secondary | ICD-10-CM

## 2013-11-20 MED ORDER — SODIUM CHLORIDE 0.9 % IV SOLN: 500.0000 mL | INTRAVENOUS | Status: DC

## 2013-11-20 NOTE — Progress Notes (Signed)
Called to room to assist during endoscopic procedure.  Patient ID and intended procedure confirmed with present staff. Received instructions for my participation in the procedure from the performing physician.  

## 2013-11-20 NOTE — Op Note (Signed)
[place]  [address] [place], [address]   COLONOSCOPY PROCEDURE REPORT  PATIENT: Jessica Lara, Jessica Lara  MR#: [id number] BIRTHDATE: [birth year] , 79  yrs. old GENDER: Female ENDOSCOPIST: Eustace Quail, MD REFERRED JK:DTOIZTI Marquis Lunch, M.D. PROCEDURE DATE:  11/20/2013 PROCEDURE:   Colonoscopy with snare polypectomy x 1 First Screening Colonoscopy - Avg.  risk and is 50 yrs.  old or older Yes.  Prior Negative Screening - Now for repeat screening. N/A  History of Adenoma - Now for follow-up colonoscopy & has been > or = to 3 yrs.  N/A  Polyps Removed Today? Yes. ASA CLASS:   Class II INDICATIONS:average risk screening. MEDICATIONS: MAC sedation, administered by CRNA and propofol (Diprivan) 200mg  IV  DESCRIPTION OF PROCEDURE:   After the risks benefits and alternatives of the procedure were thoroughly explained, informed consent was obtained.  A digital rectal exam revealed no abnormalities of the rectum.   The [id number]  endoscope was introduced through the anus and advanced to the cecum, which was identified by both the appendix and ileocecal valve. No adverse events experienced.   The quality of the prep was excellent, using MoviPrep  The instrument was then slowly withdrawn as the colon was fully examined.   COLON FINDINGS: A diminutive polyp was found in the descending colon.  A polypectomy was performed with a cold snare.  The resection was complete and the polyp tissue was completely retrieved.   The colon mucosa was otherwise normal.  Retroflexed views revealed internal hemorrhoids. The time to cecum=2 minutes 25 seconds.  Withdrawal time=10 minutes 19 seconds.  The scope was withdrawn and the procedure completed. COMPLICATIONS: There were no complications.  ENDOSCOPIC IMPRESSION: 1.   Diminutive polyp was found in the descending colon; polypectomy was performed with a cold snare 2.   The colon mucosa was otherwise  normal  RECOMMENDATIONS: 1. Repeat colonoscopy in 5 years if polyp adenomatous; otherwise 10 years   eSigned:  Eustace Quail, MD 11/20/2013 11:10 AM   cc: The Patient and Rowe Clack, MD

## 2013-11-20 NOTE — Progress Notes (Signed)
Pt drowsy  Adequate resp on RA  arousable by verbal, tactile, pain.  Report given to PACU RN

## 2013-11-20 NOTE — Patient Instructions (Signed)
Discharge instructions given with verbal understanding. Handout on polyps. Resume previous medications. YOU HAD AN ENDOSCOPIC PROCEDURE TODAY AT THE [facility]: Refer to the procedure report that was given to you for any specific questions about what was found during the examination.  If the procedure report does not answer your questions, please call your gastroenterologist to clarify.  If you requested that your care partner not be given the details of your procedure findings, then the procedure report has been included in a sealed envelope for you to review at your convenience later.  YOU SHOULD EXPECT: Some feelings of bloating in the abdomen. Passage of more gas than usual.  Walking can help get rid of the air that was put into your GI tract during the procedure and reduce the bloating. If you had a lower endoscopy (such as a colonoscopy or flexible sigmoidoscopy) you may notice spotting of blood in your stool or on the toilet paper. If you underwent a bowel prep for your procedure, then you may not have a normal bowel movement for a few days.  DIET: Your first meal following the procedure should be a light meal and then it is ok to progress to your normal diet.  A half-sandwich or bowl of soup is an example of a good first meal.  Heavy or fried foods are harder to digest and may make you feel nauseous or bloated.  Likewise meals heavy in dairy and vegetables can cause extra gas to form and this can also increase the bloating.  Drink plenty of fluids but you should avoid alcoholic beverages for 24 hours.  ACTIVITY: Your care partner should take you home directly after the procedure.  You should plan to take it easy, moving slowly for the rest of the day.  You can resume normal activity the day after the procedure however you should NOT DRIVE or use heavy machinery for 24 hours (because of the sedation medicines used during the test).    SYMPTOMS TO REPORT IMMEDIATELY: A  gastroenterologist can be reached at any hour.  During normal business hours, 8:30 AM to 5:00 PM Monday through Friday, call [phone].  After hours and on weekends, please call the GI answering service at [phone] who will take a message and have the physician on call contact you.   Following lower endoscopy (colonoscopy or flexible sigmoidoscopy):  Excessive amounts of blood in the stool  Significant tenderness or worsening of abdominal pains  Swelling of the abdomen that is new, acute  Fever of 100F or higher  FOLLOW UP: If any biopsies were taken you will be contacted by phone or by letter within the next 1-3 weeks.  Call your gastroenterologist if you have not heard about the biopsies in 3 weeks.  Our staff will call the home number listed on your records the next business day following your procedure to check on you and address any questions or concerns that you may have at that time regarding the information given to you following your procedure. This is a courtesy call and so if there is no answer at the home number and we have not heard from you through the emergency physician on call, we will assume that you have returned to your regular daily activities without incident.  SIGNATURES/CONFIDENTIALITY: You and/or your care partner have signed paperwork which will be entered into your electronic medical record.  These signatures attest to the fact that that the information above on your After Visit Summary has been   reviewed and is understood.  Full responsibility of the confidentiality of this discharge information lies with you and/or your care-partner. 

## 2013-11-21 ENCOUNTER — Telehealth: Payer: Self-pay | Admitting: *Deleted

## 2013-11-21 NOTE — Telephone Encounter (Signed)
Message left

## 2013-11-24 ENCOUNTER — Encounter: Payer: Self-pay | Admitting: Internal Medicine

## 2014-02-15 ENCOUNTER — Ambulatory Visit: Payer: BC Managed Care – PPO | Admitting: Internal Medicine

## 2014-07-25 ENCOUNTER — Ambulatory Visit: Payer: Self-pay | Admitting: Family

## 2014-07-25 ENCOUNTER — Other Ambulatory Visit: Payer: Self-pay

## 2014-07-25 MED ORDER — LISINOPRIL-HYDROCHLOROTHIAZIDE 20-12.5 MG PO TABS: 1.0000 | ORAL_TABLET | Freq: Every day | ORAL | Status: DC

## 2015-04-22 ENCOUNTER — Other Ambulatory Visit: Payer: Self-pay | Admitting: Internal Medicine

## 2015-07-27 ENCOUNTER — Other Ambulatory Visit: Payer: Self-pay | Admitting: Internal Medicine

## 2015-07-29 NOTE — Telephone Encounter (Signed)
Pt schedule to see Marya Amsler on 08/06/15, can we give her some until she comes in for the appt

## 2015-08-06 ENCOUNTER — Encounter: Payer: Self-pay | Admitting: Family

## 2015-08-06 ENCOUNTER — Ambulatory Visit (INDEPENDENT_AMBULATORY_CARE_PROVIDER_SITE_OTHER): Payer: BLUE CROSS/BLUE SHIELD | Admitting: Family

## 2015-08-06 ENCOUNTER — Ambulatory Visit (INDEPENDENT_AMBULATORY_CARE_PROVIDER_SITE_OTHER)
Admission: RE | Admit: 2015-08-06 | Discharge: 2015-08-06 | Disposition: A | Discharge status: Home / Self Care | Payer: BLUE CROSS/BLUE SHIELD | Source: Ambulatory Visit | Attending: Family | Admitting: Family

## 2015-08-06 VITALS — BP 182/90 | HR 66 | Temp 97.8°F | Resp 14 | Ht 66.0 in | Wt 149.1 lb

## 2015-08-06 DIAGNOSIS — I10 Essential (primary) hypertension: Secondary | ICD-10-CM | POA: Diagnosis not present

## 2015-08-06 DIAGNOSIS — M25511 Pain in right shoulder: Secondary | ICD-10-CM

## 2015-08-06 DIAGNOSIS — G479 Sleep disorder, unspecified: Secondary | ICD-10-CM | POA: Diagnosis not present

## 2015-08-06 MED ORDER — IBUPROFEN-FAMOTIDINE 800-26.6 MG PO TABS: 1.0000 | ORAL_TABLET | Freq: Three times a day (TID) | ORAL | Status: DC | PRN

## 2015-08-06 MED ORDER — LISINOPRIL-HYDROCHLOROTHIAZIDE 20-12.5 MG PO TABS: 1.0000 | ORAL_TABLET | Freq: Every day | ORAL | Status: DC

## 2015-08-06 MED ORDER — DICLOFENAC SODIUM 2 % TD SOLN: 1.0000 "application " | Freq: Two times a day (BID) | TRANSDERMAL | Status: DC | PRN

## 2015-08-06 MED ORDER — TRAZODONE HCL 50 MG PO TABS: 25.0000 mg | ORAL_TABLET | Freq: Every evening | ORAL | Status: DC | PRN

## 2015-08-06 NOTE — Assessment & Plan Note (Signed)
Symptoms and exam concerning for rotator cuff tear. Obtain x-rays to rule out calcific tendinitis. Obtain MRI. Home exercise therapy and ice initiated. Start Duexis and Pennsaid as needed for discomfort. Follow up if symptoms worsen prior to imaging.

## 2015-08-06 NOTE — Patient Instructions (Signed)
Thank you for choosing [facility].  Summary/Instructions:  Please ice multiple times per day.  They will call to schedule MRI.   Your prescription(s) have been submitted to your pharmacy or been printed and provided for you. Please take as directed and contact our office if you believe you are having problem(s) with the medication(s) or have any questions.  Please stop by radiology on the basement level of the building for your x-rays. Your results will be released to [facility] (or called to you) after review, usually within 72 hours after test completion. If any treatments or changes are necessary, you will be notified at that same time.  Referrals have been made during this visit. You should expect to hear back from our schedulers in about 7-10 days in regards to establishing an appointment with the specialists we discussed.   If your symptoms worsen or fail to improve, please contact our office for further instruction, or in case of emergency go directly to the emergency room at the closest medical facility.   Impingement Syndrome, Rotator Cuff, Bursitis With Rehab Impingement syndrome is a condition that involves inflammation of the tendons of the rotator cuff and the subacromial bursa, that causes pain in the shoulder. The rotator cuff consists of four tendons and muscles that control much of the shoulder and upper arm function. The subacromial bursa is a fluid filled sac that helps reduce friction between the rotator cuff and one of the bones of the shoulder (acromion). Impingement syndrome is usually an overuse injury that causes swelling of the bursa (bursitis), swelling of the tendon (tendonitis), and/or a tear of the tendon (strain). Strains are classified into three categories. Grade 1 strains cause pain, but the tendon is not lengthened. Grade 2 strains include a lengthened ligament, due to the ligament being stretched or partially ruptured. With grade 2 strains there is still  function, although the function may be decreased. Grade 3 strains include a complete tear of the tendon or muscle, and function is usually impaired. SYMPTOMS  1. Pain around the shoulder, often at the outer portion of the upper arm. 2. Pain that gets worse with shoulder function, especially when reaching overhead or lifting. 3. Sometimes, aching when not using the arm. 4. Pain that wakes you up at night. 5. Sometimes, tenderness, swelling, warmth, or redness over the affected area. 6. Loss of strength. 7. Limited motion of the shoulder, especially reaching behind the back (to the back pocket or to unhook bra) or across your body. 8. Crackling sound (crepitation) when moving the arm. 9. Biceps tendon pain and inflammation (in the front of the shoulder). Worse when bending the elbow or lifting. CAUSES  Impingement syndrome is often an overuse injury, in which chronic (repetitive) motions cause the tendons or bursa to become inflamed. A strain occurs when a force is paced on the tendon or muscle that is greater than it can withstand. Common mechanisms of injury include: Stress from sudden increase in duration, frequency, or intensity of training. 1. Direct hit (trauma) to the shoulder. 2. Aging, erosion of the tendon with normal use. 3. Bony bump on shoulder (acromial spur). RISK INCREASES WITH:  Contact sports (football, wrestling, boxing).  Throwing sports (baseball, tennis, volleyball).  Weightlifting and bodybuilding.  Heavy labor.  Previous injury to the rotator cuff, including impingement.  Poor shoulder strength and flexibility.  Failure to warm up properly before activity.  Inadequate protective equipment.  Old age.  Bony bump on shoulder (acromial spur). PREVENTION   Warm  up and stretch properly before activity.  Allow for adequate recovery between workouts.  Maintain physical fitness:  Strength, flexibility, and endurance.  Cardiovascular fitness.  Learn and  use proper exercise technique. PROGNOSIS  If treated properly, impingement syndrome usually goes away within 6 weeks. Sometimes surgery is required.  RELATED COMPLICATIONS   Longer healing time if not properly treated, or if not given enough time to heal.  Recurring symptoms, that result in a chronic condition.  Shoulder stiffness, frozen shoulder, or loss of motion.  Rotator cuff tendon tear.  Recurring symptoms, especially if activity is resumed too soon, with overuse, with a direct blow, or when using poor technique. TREATMENT  Treatment first involves the use of ice and medicine, to reduce pain and inflammation. The use of strengthening and stretching exercises may help reduce pain with activity. These exercises may be performed at home or with a therapist. If non-surgical treatment is unsuccessful after more than 6 months, surgery may be advised. After surgery and rehabilitation, activity is usually possible in 3 months.  MEDICATION  If pain medicine is needed, nonsteroidal anti-inflammatory medicines (aspirin and ibuprofen), or other minor pain relievers (acetaminophen), are often advised.  Do not take pain medicine for 7 days before surgery.  Prescription pain relievers may be given, if your caregiver thinks they are needed. Use only as directed and only as much as you need.  Corticosteroid injections may be given by your caregiver. These injections should be reserved for the most serious cases, because they may only be given a certain number of times. HEAT AND COLD  Cold treatment (icing) should be applied for 10 to 15 minutes every 2 to 3 hours for inflammation and pain, and immediately after activity that aggravates your symptoms. Use ice packs or an ice massage.  Heat treatment may be used before performing stretching and strengthening activities prescribed by your caregiver, physical therapist, or athletic trainer. Use a heat pack or a warm water soak. SEEK MEDICAL CARE IF:     Symptoms get worse or do not improve in 4 to 6 weeks, despite treatment.  New, unexplained symptoms develop. (Drugs used in treatment may produce side effects.) EXERCISES  RANGE OF MOTION (ROM) AND STRETCHING EXERCISES - Impingement Syndrome (Rotator Cuff  Tendinitis, Bursitis) These exercises may help you when beginning to rehabilitate your injury. Your symptoms may go away with or without further involvement from your physician, physical therapist or athletic trainer. While completing these exercises, remember:   Restoring tissue flexibility helps normal motion to return to the joints. This allows healthier, less painful movement and activity.  An effective stretch should be held for at least 30 seconds.  A stretch should never be painful. You should only feel a gentle lengthening or release in the stretched tissue. STRETCH - Flexion, Standing  Stand with good posture. With an underhand grip on your right / left hand, and an overhand grip on the opposite hand, grasp a broomstick or cane so that your hands are a little more than shoulder width apart.  Keeping your right / left elbow straight and shoulder muscles relaxed, push the stick with your opposite hand, to raise your right / left arm in front of your body and then overhead. Raise your arm until you feel a stretch in your right / left shoulder, but before you have increased shoulder pain.  Try to avoid shrugging your right / left shoulder as your arm rises, by keeping your shoulder blade tucked down and toward your mid-back  spine. Hold for __________ seconds.  Slowly return to the starting position. Repeat __________ times. Complete this exercise __________ times per day. STRETCH - Abduction, Supine  Lie on your back. With an underhand grip on your right / left hand and an overhand grip on the opposite hand, grasp a broomstick or cane so that your hands are a little more than shoulder width apart.  Keeping your right / left  elbow straight and your shoulder muscles relaxed, push the stick with your opposite hand, to raise your right / left arm out to the side of your body and then overhead. Raise your arm until you feel a stretch in your right / left shoulder, but before you have increased shoulder pain.  Try to avoid shrugging your right / left shoulder as your arm rises, by keeping your shoulder blade tucked down and toward your mid-back spine. Hold for __________ seconds.  Slowly return to the starting position. Repeat __________ times. Complete this exercise __________ times per day. ROM - Flexion, Active-Assisted  Lie on your back. You may bend your knees for comfort.  Grasp a broomstick or cane so your hands are about shoulder width apart. Your right / left hand should grip the end of the stick, so that your hand is positioned "thumbs-up," as if you were about to shake hands.  Using your healthy arm to lead, raise your right / left arm overhead, until you feel a gentle stretch in your shoulder. Hold for __________ seconds.  Use the stick to assist in returning your right / left arm to its starting position. Repeat __________ times. Complete this exercise __________ times per day.  ROM - Internal Rotation, Supine   Lie on your back on a firm surface. Place your right / left elbow about 60 degrees away from your side. Elevate your elbow with a folded towel, so that the elbow and shoulder are the same height.  Using a broomstick or cane and your strong arm, pull your right / left hand toward your body until you feel a gentle stretch, but no increase in your shoulder pain. Keep your shoulder and elbow in place throughout the exercise.  Hold for __________ seconds. Slowly return to the starting position. Repeat __________ times. Complete this exercise __________ times per day. STRETCH - Internal Rotation  Place your right / left hand behind your back, palm up.  Throw a towel or belt over your opposite  shoulder. Grasp the towel with your right / left hand.  While keeping an upright posture, gently pull up on the towel, until you feel a stretch in the front of your right / left shoulder.  Avoid shrugging your right / left shoulder as your arm rises, by keeping your shoulder blade tucked down and toward your mid-back spine.  Hold for __________ seconds. Release the stretch, by lowering your healthy hand. Repeat __________ times. Complete this exercise __________ times per day. ROM - Internal Rotation   Using an underhand grip, grasp a stick behind your back with both hands.  While standing upright with good posture, slide the stick up your back until you feel a mild stretch in the front of your shoulder.  Hold for __________ seconds. Slowly return to your starting position. Repeat __________ times. Complete this exercise __________ times per day.  STRETCH - Posterior Shoulder Capsule   Stand or sit with good posture. Grasp your right / left elbow and draw it across your chest, keeping it at the same height as your  shoulder.  Pull your elbow, so your upper arm comes in closer to your chest. Pull until you feel a gentle stretch in the back of your shoulder.  Hold for __________ seconds. Repeat __________ times. Complete this exercise __________ times per day. STRENGTHENING EXERCISES - Impingement Syndrome (Rotator Cuff Tendinitis, Bursitis) These exercises may help you when beginning to rehabilitate your injury. They may resolve your symptoms with or without further involvement from your physician, physical therapist or athletic trainer. While completing these exercises, remember:  Muscles can gain both the endurance and the strength needed for everyday activities through controlled exercises.  Complete these exercises as instructed by your physician, physical therapist or athletic trainer. Increase the resistance and repetitions only as guided.  You may experience muscle soreness or  fatigue, but the pain or discomfort you are trying to eliminate should never worsen during these exercises. If this pain does get worse, stop and make sure you are following the directions exactly. If the pain is still present after adjustments, discontinue the exercise until you can discuss the trouble with your clinician.  During your recovery, avoid activity or exercises which involve actions that place your injured hand or elbow above your head or behind your back or head. These positions stress the tissues which you are trying to heal. STRENGTH - Scapular Depression and Adduction   With good posture, sit on a firm chair. Support your arms in front of you, with pillows, arm rests, or on a table top. Have your elbows in line with the sides of your body.  Gently draw your shoulder blades down and toward your mid-back spine. Gradually increase the tension, without tensing the muscles along the top of your shoulders and the back of your neck.  Hold for __________ seconds. Slowly release the tension and relax your muscles completely before starting the next repetition.  After you have practiced this exercise, remove the arm support and complete the exercise in standing as well as sitting position. Repeat __________ times. Complete this exercise __________ times per day.  STRENGTH - Shoulder Abductors, Isometric  With good posture, stand or sit about 4-6 inches from a wall, with your right / left side facing the wall.  Bend your right / left elbow. Gently press your right / left elbow into the wall. Increase the pressure gradually, until you are pressing as hard as you can, without shrugging your shoulder or increasing any shoulder discomfort.  Hold for __________ seconds.  Release the tension slowly. Relax your shoulder muscles completely before you begin the next repetition. Repeat __________ times. Complete this exercise __________ times per day.  STRENGTH - External Rotators,  Isometric  Keep your right / left elbow at your side and bend it 90 degrees.  Step into a door frame so that the outside of your right / left wrist can press against the door frame without your upper arm leaving your side.  Gently press your right / left wrist into the door frame, as if you were trying to swing the back of your hand away from your stomach. Gradually increase the tension, until you are pressing as hard as you can, without shrugging your shoulder or increasing any shoulder discomfort.  Hold for __________ seconds.  Release the tension slowly. Relax your shoulder muscles completely before you begin the next repetition. Repeat __________ times. Complete this exercise __________ times per day.  STRENGTH - Supraspinatus   Stand or sit with good posture. Grasp a __________ weight, or an exercise band  or tubing, so that your hand is "thumbs-up," like you are shaking hands.  Slowly lift your right / left arm in a "V" away from your thigh, diagonally into the space between your side and straight ahead. Lift your hand to shoulder height or as far as you can, without increasing any shoulder pain. At first, many people do not lift their hands above shoulder height.  Avoid shrugging your right / left shoulder as your arm rises, by keeping your shoulder blade tucked down and toward your mid-back spine.  Hold for __________ seconds. Control the descent of your hand, as you slowly return to your starting position. Repeat __________ times. Complete this exercise __________ times per day.  STRENGTH - External Rotators  Secure a rubber exercise band or tubing to a fixed object (table, pole) so that it is at the same height as your right / left elbow when you are standing or sitting on a firm surface.  Stand or sit so that the secured exercise band is at your uninjured side.  Bend your right / left elbow 90 degrees. Place a folded towel or small pillow under your right / left arm, so that  your elbow is a few inches away from your side.  Keeping the tension on the exercise band, pull it away from your body, as if pivoting on your elbow. Be sure to keep your body steady, so that the movement is coming only from your rotating shoulder.  Hold for __________ seconds. Release the tension in a controlled manner, as you return to the starting position. Repeat __________ times. Complete this exercise __________ times per day.  STRENGTH - Internal Rotators   Secure a rubber exercise band or tubing to a fixed object (table, pole) so that it is at the same height as your right / left elbow when you are standing or sitting on a firm surface.  Stand or sit so that the secured exercise band is at your right / left side.  Bend your elbow 90 degrees. Place a folded towel or small pillow under your right / left arm so that your elbow is a few inches away from your side.  Keeping the tension on the exercise band, pull it across your body, toward your stomach. Be sure to keep your body steady, so that the movement is coming only from your rotating shoulder.  Hold for __________ seconds. Release the tension in a controlled manner, as you return to the starting position. Repeat __________ times. Complete this exercise __________ times per day.  STRENGTH - Scapular Protractors, Standing   Stand arms length away from a wall. Place your hands on the wall, keeping your elbows straight.  Begin by dropping your shoulder blades down and toward your mid-back spine.  To strengthen your protractors, keep your shoulder blades down, but slide them forward on your rib cage. It will feel as if you are lifting the back of your rib cage away from the wall. This is a subtle motion and can be challenging to complete. Ask your caregiver for further instruction, if you are not sure you are doing the exercise correctly.  Hold for __________ seconds. Slowly return to the starting position, resting the muscles  completely before starting the next repetition. Repeat __________ times. Complete this exercise __________ times per day. STRENGTH - Scapular Protractors, Supine  Lie on your back on a firm surface. Extend your right / left arm straight into the air while holding a __________ weight in your hand.  Keeping your head and back in place, lift your shoulder off the floor.  Hold for __________ seconds. Slowly return to the starting position, and allow your muscles to relax completely before starting the next repetition. Repeat __________ times. Complete this exercise __________ times per day. STRENGTH - Scapular Protractors, Quadruped  Get onto your hands and knees, with your shoulders directly over your hands (or as close as you can be, comfortably).  Keeping your elbows locked, lift the back of your rib cage up into your shoulder blades, so your mid-back rounds out. Keep your neck muscles relaxed.  Hold this position for __________ seconds. Slowly return to the starting position and allow your muscles to relax completely before starting the next repetition. Repeat __________ times. Complete this exercise __________ times per day.  STRENGTH - Scapular Retractors  Secure a rubber exercise band or tubing to a fixed object (table, pole), so that it is at the height of your shoulders when you are either standing, or sitting on a firm armless chair.  With a palm down grip, grasp an end of the band in each hand. Straighten your elbows and lift your hands straight in front of you, at shoulder height. Step back, away from the secured end of the band, until it becomes tense.  Squeezing your shoulder blades together, draw your elbows back toward your sides, as you bend them. Keep your upper arms lifted away from your body throughout the exercise.  Hold for __________ seconds. Slowly ease the tension on the band, as you reverse the directions and return to the starting position. Repeat __________ times.  Complete this exercise __________ times per day. STRENGTH - Shoulder Extensors   Secure a rubber exercise band or tubing to a fixed object (table, pole) so that it is at the height of your shoulders when you are either standing, or sitting on a firm armless chair.  With a thumbs-up grip, grasp an end of the band in each hand. Straighten your elbows and lift your hands straight in front of you, at shoulder height. Step back, away from the secured end of the band, until it becomes tense.  Squeezing your shoulder blades together, pull your hands down to the sides of your thighs. Do not allow your hands to go behind you.  Hold for __________ seconds. Slowly ease the tension on the band, as you reverse the directions and return to the starting position. Repeat __________ times. Complete this exercise __________ times per day.  STRENGTH - Scapular Retractors and External Rotators   Secure a rubber exercise band or tubing to a fixed object (table, pole) so that it is at the height as your shoulders, when you are either standing, or sitting on a firm armless chair.  With a palm down grip, grasp an end of the band in each hand. Bend your elbows 90 degrees and lift your elbows to shoulder height, at your sides. Step back, away from the secured end of the band, until it becomes tense.  Squeezing your shoulder blades together, rotate your shoulders so that your upper arms and elbows remain stationary, but your fists travel upward to head height.  Hold for __________ seconds. Slowly ease the tension on the band, as you reverse the directions and return to the starting position. Repeat __________ times. Complete this exercise __________ times per day.  STRENGTH - Scapular Retractors and External Rotators, Rowing   Secure a rubber exercise band or tubing to a fixed object (table, pole) so that it  is at the height of your shoulders, when you are either standing, or sitting on a firm armless chair.  With a  palm down grip, grasp an end of the band in each hand. Straighten your elbows and lift your hands straight in front of you, at shoulder height. Step back, away from the secured end of the band, until it becomes tense.  Step 1: Squeeze your shoulder blades together. Bending your elbows, draw your hands to your chest, as if you are rowing a boat. At the end of this motion, your hands and elbow should be at shoulder height and your elbows should be out to your sides.  Step 2: Rotate your shoulders, to raise your hands above your head. Your forearms should be vertical and your upper arms should be horizontal.  Hold for __________ seconds. Slowly ease the tension on the band, as you reverse the directions and return to the starting position. Repeat __________ times. Complete this exercise __________ times per day.  STRENGTH - Scapular Depressors  Find a sturdy chair without wheels, such as a dining room chair.  Keeping your feet on the floor, and your hands on the chair arms, lift your bottom up from the seat, and lock your elbows.  Keeping your elbows straight, allow gravity to pull your body weight down. Your shoulders will rise toward your ears.  Raise your body against gravity by drawing your shoulder blades down your back, shortening the distance between your shoulders and ears. Although your feet should always maintain contact with the floor, your feet should progressively support less body weight, as you get stronger.  Hold for __________ seconds. In a controlled and slow manner, lower your body weight to begin the next repetition. Repeat __________ times. Complete this exercise __________ times per day.    This information is not intended to replace advice given to you by your health care provider. Make sure you discuss any questions you have with your health care provider.   Document Released: 06/22/2005 Document Revised: 07/13/2014 Document Reviewed: 10/04/2008 [employer]vier Interactive  Patient Education 2016 [employer].  Recommendations for improving sleep:   Avoid having pets sleep in the bedroom  Avoid caffeine consumption after 4pm  Keep bedroom cool and conducive to sleep  Avoid nicotine use, especially in the evening  Avoid exercise within 2-3 hours before bedtime  Stimulus Control:   Go to bed only when sleepy  Use the bedroom for sleep and sex only  Go to another room if you are unable to fall asleep within 15 to 20 minutes  Read or engage in other quiet activities and return to bed only when sleepy.

## 2015-08-06 NOTE — Assessment & Plan Note (Signed)
Hypertension remains uncontrolled current regimen and above goal 140/90. Patient indicates she has not had her medication in the past 7 days. Denies symptoms of end organ damage. Restart lisinopril-hydrochlorothiazide. Continue to monitor blood pressure at home. Follow-up in one month to determine blood pressure control.

## 2015-08-06 NOTE — Assessment & Plan Note (Signed)
Sleep disturbance most likely related to rotator cuff pain that is refractory to over-the-counter medications. Start trazodone as needed for sleep. Discussed importance of adequate sleep hygiene and maintaining good habits. Follow-up in one month or sooner if needed.

## 2015-08-06 NOTE — Progress Notes (Signed)
Pre visit review using our clinic review tool, if applicable. No additional management support is needed unless otherwise documented below in the visit note. 

## 2015-08-06 NOTE — Progress Notes (Signed)
Subjective:    Patient ID: Jessica Lara, female    DOB: [birth year], 55 y.o.   MRN: [id number]  Chief Complaint  Patient presents with  . Establish Care    has trouble sleeping and has pain in neck, back and hands, has arthritis in her back    HPI:  Jessica Lara is a 55 y.o. female who  has a past medical history of H/O: hysterectomy; Hypertension; Other and unspecified hyperlipidemia; Unspecified essential hypertension; SMOKER; ANXIETY DEPRESSION; and Chicken pox. and presents today for an office visit to establish care.  1.) Sleep disturbance - Associated symptom of sleep disturbance has been going on for about 9 months. Currently averaging about 3-4 hours of sleep. Describes that ability to fall asleep but has some difficulties staying asleep. Modifying factors include OTC medications including Unisom and melatonin. Attempts to go to bed nightly around 9pm.  2.) Arthralgias - Associated symptom of pain located in her neck, shoulders, back and hand has been going on for about 1 year. Previously evaluated with imaging and found that she has arthritis in her neck. Describes the hand pain as aching and throbbing that feels like a heart beat. Modifying factors include a pain cream which helps a little. Working lifting parts aggravates her neck, shoulder and hands. No trauma that she can recall. Pain severity is averages about 7.5/10. She is right hand dominant. There is occasional weakness depending upon the activity.   No Known Allergies   Outpatient Prescriptions Prior to Visit  Medication Sig Dispense Refill  . lisinopril-hydrochlorothiazide (PRINZIDE,ZESTORETIC) 20-12.5 MG tablet Take 1 tablet by mouth daily. 30 tablet 0  . cyclobenzaprine (FLEXERIL) 5 MG tablet Take 1 tablet (5 mg total) by mouth 3 (three) times daily as needed for muscle spasms. 30 tablet 0  . ketorolac (TORADOL) 10 MG tablet Take 1 tablet (10 mg total) by mouth every 6 (six) hours as needed. For pain 20 tablet 0     No facility-administered medications prior to visit.     Past Medical History  Diagnosis Date  . H/O: hysterectomy   . Hypertension   . Other and unspecified hyperlipidemia   . Unspecified essential hypertension   . SMOKER   . ANXIETY DEPRESSION   . Chicken pox      Past Surgical History  Procedure Laterality Date  . Abdominal hysterectomy  1999    fibroids  . Single oopherectomy  1999  . Esophagogastroduodenoscopy  01/09/2012    Procedure: ESOPHAGOGASTRODUODENOSCOPY (EGD);  Surgeon: Irene Shipper, MD;  Location: [facility] ENDOSCOPY;  Service: Endoscopy;  Laterality: N/A;     Family History  Problem Relation Age of Onset  . Diabetes Mother   . Hypertension Mother   . Hyperlipidemia Mother   . Stomach cancer Father   . Colon cancer Neg Hx   . Esophageal cancer Neg Hx   . Rectal cancer Neg Hx      Social History   Social History  . Marital Status: Single    Spouse Name: N/A  . Number of Children: 1  . Years of Education: 12   Occupational History  . [employer] surgeon    Social History Main Topics  . Smoking status: Current Every Day Smoker -- 1.00 packs/day for 33 years    Types: Cigarettes  . Smokeless tobacco: Never Used  . Alcohol Use: 1.2 oz/week    2 Cans of beer per week  . Drug Use: No  . Sexual Activity: Yes  Birth Control/ Protection: None   Other Topics Concern  . Not on file   Social History Narrative   Fun: Watch TV   Denies abuse and feels safe at home.     Review of Systems  Constitutional: Negative for fever and chills.  Respiratory: Negative for chest tightness and shortness of breath.   Musculoskeletal: Positive for back pain, arthralgias and neck pain.  Neurological: Positive for weakness and numbness. Negative for dizziness.      Objective:    BP 182/90 mmHg  Pulse 66  Temp(Src) 97.8 F (36.6 C) (Oral)  Resp 14  Ht 5\' 6"  (1.676 m)  Wt 149 lb 1.9 oz (67.64 kg)  BMI 24.08 kg/m2  SpO2 98% Nursing note and vital signs  reviewed.  Physical Exam  Constitutional: She is oriented to person, place, and time. She appears well-developed and well-nourished. No distress.  Neck:  No obvious deformity, discoloration, or edema noted. No palpable tenderness able to be elicited on cervical spine, however there is some on the paraspinal musculature to the right. Neck range of motion is within normal limits in all directions. Distal pulses, sensation, and reflexes are intact and appropriate.  Cardiovascular: Normal rate, regular rhythm, normal heart sounds and intact distal pulses.   Pulmonary/Chest: Effort normal and breath sounds normal.  Musculoskeletal:  Right shoulder - no obvious deformity, discoloration, or edema noted. Significant tenderness located over subacromial space and acromioclavicular joint. There is significant decreased flexion and abduction with inability to exceed 90. Strength is 3+ in all directions. Positive empty can. Negative apprehension. Negative impingement. Distal pulses, sensation, and reflexes are intact and appropriate.  Neurological: She is alert and oriented to person, place, and time.  Skin: Skin is warm and dry.  Psychiatric: She has a normal mood and affect. Her behavior is normal. Judgment and thought content normal.       Assessment & Plan:   Problem List Items Addressed This Visit      Other   Right shoulder pain - Primary    Symptoms and exam concerning for rotator cuff tear. Obtain x-rays to rule out calcific tendinitis. Obtain MRI. Home exercise therapy and ice initiated. Start Duexis and Pennsaid as needed for discomfort. Follow up if symptoms worsen prior to imaging.       Relevant Medications   Ibuprofen-Famotidine (DUEXIS) 800-26.6 MG TABS   Diclofenac Sodium (PENNSAID) 2 % SOLN   Other Relevant Orders   DG Shoulder Right   MR Shoulder Right Wo Contrast   Sleep disturbance    Sleep disturbance most likely related to rotator cuff pain that is refractory to  over-the-counter medications. Start trazodone as needed for sleep. Discussed importance of adequate sleep hygiene and maintaining good habits. Follow-up in one month or sooner if needed.      Relevant Medications   traZODone (DESYREL) 50 MG tablet

## 2015-08-07 ENCOUNTER — Telehealth: Payer: Self-pay | Admitting: Family

## 2015-08-07 NOTE — Telephone Encounter (Signed)
Please inform patient that her x-rays show degenerative joint changes in her shoulder which are likely to contribute to her pain, however are not likely the underlying cause of her pain. Therefore I would like her to continue with the MRI that we had discussed. Continue conservative treatment with medications. And follow-up after MRI or sooner if symptoms worsen.

## 2015-08-13 NOTE — Telephone Encounter (Signed)
Pt aware of results 

## 2015-08-21 ENCOUNTER — Telehealth: Payer: Self-pay | Admitting: Family

## 2015-08-21 ENCOUNTER — Ambulatory Visit
Admission: RE | Admit: 2015-08-21 | Discharge: 2015-08-21 | Disposition: A | Discharge status: Home / Self Care | Payer: BLUE CROSS/BLUE SHIELD | Source: Ambulatory Visit | Attending: Family | Admitting: Family

## 2015-08-21 DIAGNOSIS — M25511 Pain in right shoulder: Secondary | ICD-10-CM

## 2015-08-21 NOTE — Telephone Encounter (Signed)
Please inform patient that her MRI results show that there is a partial rotator cuff tear. Please continue with conservative therapy at this time and I will send a referral to orthopedics. It is unlikely that she will need surgery however it remains a possibility.

## 2015-08-22 NOTE — Telephone Encounter (Signed)
Spoke with pt to inform.  

## 2015-09-01 ENCOUNTER — Other Ambulatory Visit: Payer: Self-pay | Admitting: Family

## 2015-09-04 ENCOUNTER — Telehealth: Payer: Self-pay | Admitting: Family

## 2015-09-04 MED ORDER — LISINOPRIL-HYDROCHLOROTHIAZIDE 20-12.5 MG PO TABS: 1.0000 | ORAL_TABLET | Freq: Every day | ORAL | Status: DC

## 2015-09-04 NOTE — Telephone Encounter (Signed)
[facility]

## 2015-09-04 NOTE — Telephone Encounter (Signed)
Pt went to get refill for her lisinopril-hydrochlorothiazide and they told her she had no refills. She had a prescription filled on 1/23 for 30 days. This is the prescription she is out of. The pharmacy doesn't have anything else for her.  Can you resend the prescription that was written on 1/[facility] [address]

## 2015-10-26 ENCOUNTER — Other Ambulatory Visit: Payer: Self-pay | Admitting: Family

## 2015-11-05 ENCOUNTER — Other Ambulatory Visit: Payer: Self-pay | Admitting: Family

## 2015-12-04 ENCOUNTER — Other Ambulatory Visit: Payer: Self-pay | Admitting: Family

## 2016-03-08 ENCOUNTER — Other Ambulatory Visit: Payer: Self-pay | Admitting: Family

## 2016-03-08 DIAGNOSIS — M25511 Pain in right shoulder: Secondary | ICD-10-CM

## 2016-03-10 ENCOUNTER — Other Ambulatory Visit: Payer: Self-pay

## 2016-03-10 MED ORDER — LISINOPRIL-HYDROCHLOROTHIAZIDE 20-12.5 MG PO TABS: 1.0000 | ORAL_TABLET | Freq: Every day | ORAL | 0 refills | Status: DC

## 2016-06-22 ENCOUNTER — Other Ambulatory Visit: Payer: Self-pay | Admitting: Family

## 2016-06-22 DIAGNOSIS — M25511 Pain in right shoulder: Secondary | ICD-10-CM

## 2016-11-09 ENCOUNTER — Telehealth: Payer: Self-pay | Admitting: Family

## 2016-11-09 NOTE — Telephone Encounter (Signed)
Called and needs PA for DUEXIS 800-26.6 MG TABS

## 2016-12-06 ENCOUNTER — Other Ambulatory Visit: Payer: Self-pay | Admitting: Family

## 2016-12-09 ENCOUNTER — Telehealth: Payer: Self-pay | Admitting: Family

## 2016-12-15 MED ORDER — LISINOPRIL-HYDROCHLOROTHIAZIDE 20-12.5 MG PO TABS: 1.0000 | ORAL_TABLET | Freq: Every day | ORAL | 0 refills | Status: DC

## 2016-12-15 NOTE — Telephone Encounter (Signed)
Pt called checking on this medication refill. I scheduled her an appointment to come in on Monday. Can a supply be sent in for her to get her through until her appointment?

## 2016-12-15 NOTE — Telephone Encounter (Signed)
[facility] 

## 2016-12-15 NOTE — Addendum Note (Signed)
Addended by: Earnstine Regal on: 12/15/2016 04:50 PM   Modules accepted: Orders

## 2016-12-16 ENCOUNTER — Other Ambulatory Visit: Payer: Self-pay | Admitting: Family

## 2016-12-21 ENCOUNTER — Ambulatory Visit (INDEPENDENT_AMBULATORY_CARE_PROVIDER_SITE_OTHER): Payer: BLUE CROSS/BLUE SHIELD | Admitting: Family

## 2016-12-21 ENCOUNTER — Encounter: Payer: Self-pay | Admitting: Family

## 2016-12-21 VITALS — BP 174/80 | HR 71 | Temp 98.5°F | Resp 16 | Ht 66.0 in | Wt 141.8 lb

## 2016-12-21 DIAGNOSIS — G479 Sleep disorder, unspecified: Secondary | ICD-10-CM | POA: Diagnosis not present

## 2016-12-21 DIAGNOSIS — I10 Essential (primary) hypertension: Secondary | ICD-10-CM

## 2016-12-21 MED ORDER — LISINOPRIL-HYDROCHLOROTHIAZIDE 20-12.5 MG PO TABS: 2.0000 | ORAL_TABLET | Freq: Every day | ORAL | 1 refills | Status: DC

## 2016-12-21 MED ORDER — ESZOPICLONE 1 MG PO TABS
1.0000 mg | ORAL_TABLET | Freq: Every evening | ORAL | 0 refills | Status: DC | PRN
Start: 2016-12-21 — End: 2017-04-15

## 2016-12-21 NOTE — Assessment & Plan Note (Signed)
Continues to experience sleep disturbance that is refractory to trazodone. Discontinue trazodone and start Lunesta. Encouraged good sleep hygiene. Follow up in one month or sooner if needed.

## 2016-12-21 NOTE — Assessment & Plan Note (Signed)
Blood pressure remains poorly controlled with current medication regimen and no adverse side effects or hypotensive readings. Denies worst headache of life with no new symptoms of end organ damage noted on physical exam. Increase lisinopril-hydrochlorothiazide. Encouraged to monitor blood pressure at home and follow low-sodium diet. Follow-up in one month or sooner if needed.

## 2016-12-21 NOTE — Progress Notes (Signed)
Subjective:    Patient ID: Jessica Lara, female    DOB: 1960-10-01, 56 y.o.   MRN: [id number]  Chief Complaint  Patient presents with  . Follow-up    refill of lisinopril     HPI:  Jessica Lara is a 56 y.o. female who  has a past medical history of ANXIETY DEPRESSION; Chicken pox; H/O: hysterectomy; Hypertension; Other and unspecified hyperlipidemia; SMOKER; and Unspecified essential hypertension. and presents today for a follow up office visit.  1.) Hypertension - Currently maintained on lisinopril-hydrochlorothiazide and reports taking the medications as prescribed and denies adverse side effects or hypotensive readings. Not currently checking her blood pressure at home. Denies changes in vision, worst headache of life or new symptoms of end organ damage. Working on following a low sodium diet.    BP Readings from Last 3 Encounters:  12/21/16 (!) 174/80  08/06/15 (!) 182/90  11/20/13 (!) 147/71    2.) Sleep disturbance - Currently maintained on Trazodone. Reports taking the medication as prescribed with no adverse side effects. Continues to experience difficulty with sleep currently averaging about 4 hours per night and waking up multiple times throughout the night.   No Known Allergies    Outpatient Medications Prior to Visit  Medication Sig Dispense Refill  . Diclofenac Sodium (PENNSAID) 2 % SOLN Place 1 application onto the skin 2 (two) times daily as needed. 112 g 1  . DUEXIS 800-26.6 MG TABS TAKE 1 TABLET 3 TIMES A DAY AS NEEDED 90 tablet 1  . lisinopril-hydrochlorothiazide (PRINZIDE,ZESTORETIC) 20-12.5 MG tablet Take 1 tablet by mouth daily. Must keep appt for future refills 7 tablet 0  . traZODone (DESYREL) 50 MG tablet TAKE 1/2-1 TABLET BY MOUTH AT BEDTIME AS NEEDED FOR SLEEP 30 tablet 0   No facility-administered medications prior to visit.      Review of Systems  Constitutional: Negative for chills and fever.  Eyes:       Negative for changes in vision    Respiratory: Negative for cough, chest tightness and wheezing.   Cardiovascular: Negative for chest pain, palpitations and leg swelling.  Neurological: Negative for dizziness, weakness and light-headedness.      Objective:    BP (!) 174/80 (BP Location: Left Arm, Patient Position: Sitting, Cuff Size: Normal)   Pulse 71   Temp 98.5 F (36.9 C) (Oral)   Resp 16   Ht 5\' 6"  (1.676 m)   Wt 141 lb 12.8 oz (64.3 kg)   SpO2 98%   BMI 22.89 kg/m  Nursing note and vital signs reviewed.  Physical Exam  Constitutional: She is oriented to person, place, and time. She appears well-developed and well-nourished. No distress.  Cardiovascular: Normal rate, regular rhythm, normal heart sounds and intact distal pulses.   Pulmonary/Chest: Effort normal and breath sounds normal.  Neurological: She is alert and oriented to person, place, and time.  Skin: Skin is warm and dry.  Psychiatric: She has a normal mood and affect. Her behavior is normal. Judgment and thought content normal.       Assessment & Plan:   Problem List Items Addressed This Visit      Cardiovascular and Mediastinum   Essential hypertension - Primary     Blood pressure remains poorly controlled with current medication regimen and no adverse side effects or hypotensive readings. Denies worst headache of life with no new symptoms of end organ damage noted on physical exam. Increase lisinopril-hydrochlorothiazide. Encouraged to monitor blood pressure at home and follow low-sodium  diet. Follow-up in one month or sooner if needed.      Relevant Medications   lisinopril-hydrochlorothiazide (PRINZIDE,ZESTORETIC) 20-12.5 MG tablet     Other   Sleep disturbance     Continues to experience sleep disturbance that is refractory to trazodone. Discontinue trazodone and start Lunesta. Encouraged good sleep hygiene. Follow up in one month or sooner if needed.           I have discontinued Ms. Ludden's Diclofenac Sodium, traZODone, and  DUEXIS. I have also changed her lisinopril-hydrochlorothiazide. Additionally, I am having her start on eszopiclone.   Meds ordered this encounter  Medications  . lisinopril-hydrochlorothiazide (PRINZIDE,ZESTORETIC) 20-12.5 MG tablet    Sig: Take 2 tablets by mouth daily.    Dispense:  60 tablet    Refill:  1    Order Specific Question:   Supervising Provider    Answer:   Pricilla Holm A [[id number]]  . eszopiclone (LUNESTA) 1 MG TABS tablet    Sig: Take 1 tablet (1 mg total) by mouth at bedtime as needed for sleep. Take immediately before bedtime    Dispense:  30 tablet    Refill:  0    Order Specific Question:   Supervising Provider    Answer:   Pricilla Holm A [[id number]]     Follow-up: No Follow-up on file.  Mauricio Po, FNP

## 2016-12-21 NOTE — Patient Instructions (Addendum)
Thank you for choosing [facility].  SUMMARY AND INSTRUCTIONS:  Please increase your medication to 2 tablets daily.  Encouraged to monitor blood pressure at home at least once per day and record these values.  Start Lunesta nightly to help with sleep.   Please follow up in 1 month or sooner if needed.   Medication:  Your prescription(s) have been submitted to your pharmacy or been printed and provided for you. Please take as directed and contact our office if you believe you are having problem(s) with the medication(s) or have any questions.  Labs:  Please stop by the lab on the lower level of the building for your blood work. Your results will be released to [place] (or called to you) after review, usually within 72 hours after test completion. If any changes need to be made, you will be notified at that same time.  1.) The lab is open from 7:30am to 5:30 pm Monday-Friday 2.) No appointment is necessary 3.) Fasting (if needed) is 6-8 hours after food and drink; black coffee and water are okay   Follow up:  If your symptoms worsen or fail to improve, please contact our office for further instruction, or in case of emergency go directly to the emergency room at the closest medical facility.     DASH Eating Plan DASH stands for "Dietary Approaches to Stop Hypertension." The DASH eating plan is a healthy eating plan that has been shown to reduce high blood pressure (hypertension). It may also reduce your risk for type 2 diabetes, heart disease, and stroke. The DASH eating plan may also help with weight loss. What are tips for following this plan? General guidelines  Avoid eating more than 2,300 mg (milligrams) of salt (sodium) a day. If you have hypertension, you may need to reduce your sodium intake to 1,500 mg a day.  Limit alcohol intake to no more than 1 drink a day for nonpregnant women and 2 drinks a day for men. One drink equals 12 oz of beer, 5 oz of wine, or 1 oz of  hard liquor.  Work with your health care provider to maintain a healthy body weight or to lose weight. Ask what an ideal weight is for you.  Get at least 30 minutes of exercise that causes your heart to beat faster (aerobic exercise) most days of the week. Activities may include walking, swimming, or biking.  Work with your health care provider or diet and nutrition specialist (dietitian) to adjust your eating plan to your individual calorie needs. Reading food labels  Check food labels for the amount of sodium per serving. Choose foods with less than 5 percent of the Daily Value of sodium. Generally, foods with less than 300 mg of sodium per serving fit into this eating plan.  To find whole grains, look for the word "whole" as the first word in the ingredient list. Shopping  Buy products labeled as "low-sodium" or "no salt added."  Buy fresh foods. Avoid canned foods and premade or frozen meals. Cooking  Avoid adding salt when cooking. Use salt-free seasonings or herbs instead of table salt or sea salt. Check with your health care provider or pharmacist before using salt substitutes.  Do not fry foods. Cook foods using healthy methods such as baking, boiling, grilling, and broiling instead.  Cook with heart-healthy oils, such as olive, canola, soybean, or sunflower oil. Meal planning   Eat a balanced diet that includes: ? 5 or more servings of fruits and vegetables  each day. At each meal, try to fill half of your plate with fruits and vegetables. ? Up to 6-8 servings of whole grains each day. ? Less than 6 oz of lean meat, poultry, or fish each day. A 3-oz serving of meat is about the same size as a deck of cards. One egg equals 1 oz. ? 2 servings of low-fat dairy each day. ? A serving of nuts, seeds, or beans 5 times each week. ? Heart-healthy fats. Healthy fats called Omega-3 fatty acids are found in foods such as flaxseeds and coldwater fish, like sardines, salmon, and  mackerel.  Limit how much you eat of the following: ? Canned or prepackaged foods. ? Food that is high in trans fat, such as fried foods. ? Food that is high in saturated fat, such as fatty meat. ? Sweets, desserts, sugary drinks, and other foods with added sugar. ? Full-fat dairy products.  Do not salt foods before eating.  Try to eat at least 2 vegetarian meals each week.  Eat more home-cooked food and less restaurant, buffet, and fast food.  When eating at a restaurant, ask that your food be prepared with less salt or no salt, if possible. What foods are recommended? The items listed may not be a complete list. Talk with your dietitian about what dietary choices are best for you. Grains Whole-grain or whole-wheat bread. Whole-grain or whole-wheat pasta. Brown rice. [place]. Bulgur. Whole-grain and low-sodium cereals. Pita bread. Low-fat, low-sodium crackers. Whole-wheat flour tortillas. Vegetables Fresh or frozen vegetables (raw, steamed, roasted, or grilled). Low-sodium or reduced-sodium tomato and vegetable juice. Low-sodium or reduced-sodium tomato sauce and tomato paste. Low-sodium or reduced-sodium canned vegetables. Fruits All fresh, dried, or frozen fruit. Canned fruit in natural juice (without added sugar). Meat and other protein foods Skinless chicken or [place]. Ground chicken or [place]. Pork with fat trimmed off. Fish and seafood. Egg whites. Dried beans, peas, or lentils. Unsalted nuts, nut butters, and seeds. Unsalted canned beans. Lean cuts of beef with fat trimmed off. Low-sodium, lean deli meat. Dairy Low-fat (1%) or fat-free (skim) milk. Fat-free, low-fat, or reduced-fat cheeses. Nonfat, low-sodium ricotta or cottage cheese. Low-fat or nonfat yogurt. Low-fat, low-sodium cheese. Fats and oils Soft margarine without trans fats. Vegetable oil. Low-fat, reduced-fat, or light mayonnaise and salad dressings (reduced-sodium). Canola, safflower, olive, soybean, and  sunflower oils. Avocado. Seasoning and other foods Herbs. Spices. Seasoning mixes without salt. Unsalted popcorn and pretzels. Fat-free sweets. What foods are not recommended? The items listed may not be a complete list. Talk with your dietitian about what dietary choices are best for you. Grains Baked goods made with fat, such as croissants, muffins, or some breads. Dry pasta or rice meal packs. Vegetables Creamed or fried vegetables. Vegetables in a cheese sauce. Regular canned vegetables (not low-sodium or reduced-sodium). Regular canned tomato sauce and paste (not low-sodium or reduced-sodium). Regular tomato and vegetable juice (not low-sodium or reduced-sodium). Angie Fava. Olives. Fruits Canned fruit in a light or heavy syrup. Fried fruit. Fruit in cream or butter sauce. Meat and other protein foods Fatty cuts of meat. Ribs. Fried meat. Berniece Salines. Sausage. Bologna and other processed lunch meats. Salami. Fatback. Hotdogs. Bratwurst. Salted nuts and seeds. Canned beans with added salt. Canned or smoked fish. Whole eggs or egg yolks. Chicken or [place] with skin. Dairy Whole or 2% milk, cream, and half-and-half. Whole or full-fat cream cheese. Whole-fat or sweetened yogurt. Full-fat cheese. Nondairy creamers. Whipped toppings. Processed cheese and cheese spreads. Fats and oils Butter.  Stick margarine. Lard. Shortening. Ghee. Bacon fat. Tropical oils, such as coconut, palm kernel, or palm oil. Seasoning and other foods Salted popcorn and pretzels. Onion salt, garlic salt, seasoned salt, table salt, and sea salt. Worcestershire sauce. Tartar sauce. Barbecue sauce. Teriyaki sauce. Soy sauce, including reduced-sodium. Steak sauce. Canned and packaged gravies. Fish sauce. Oyster sauce. Cocktail sauce. Horseradish that you find on the shelf. Ketchup. Mustard. Meat flavorings and tenderizers. Bouillon cubes. Hot sauce and Tabasco sauce. Premade or packaged marinades. Premade or packaged taco seasonings.  Relishes. Regular salad dressings. Where to find more information:  National Heart, Lung, and [place]: https://wilson-eaton.com/  American Heart Association: www.heart.org Summary  The DASH eating plan is a healthy eating plan that has been shown to reduce high blood pressure (hypertension). It may also reduce your risk for type 2 diabetes, heart disease, and stroke.  With the DASH eating plan, you should limit salt (sodium) intake to 2,300 mg a day. If you have hypertension, you may need to reduce your sodium intake to 1,500 mg a day.  When on the DASH eating plan, aim to eat more fresh fruits and vegetables, whole grains, lean proteins, low-fat dairy, and heart-healthy fats.  Work with your health care provider or diet and nutrition specialist (dietitian) to adjust your eating plan to your individual calorie needs. This information is not intended to replace advice given to you by your health care provider. Make sure you discuss any questions you have with your health care provider. Document Released: 06/11/2011 Document Revised: 06/15/2016 Document Reviewed: 06/15/2016 Elsevier Interactive Patient Education  2017 [employer].

## 2017-01-10 ENCOUNTER — Other Ambulatory Visit: Payer: Self-pay | Admitting: Family

## 2017-01-10 DIAGNOSIS — M25511 Pain in right shoulder: Secondary | ICD-10-CM

## 2017-02-18 ENCOUNTER — Other Ambulatory Visit: Payer: Self-pay | Admitting: Family

## 2017-03-08 ENCOUNTER — Other Ambulatory Visit: Payer: Self-pay | Admitting: Family

## 2017-03-08 DIAGNOSIS — M25511 Pain in right shoulder: Secondary | ICD-10-CM

## 2017-04-15 ENCOUNTER — Other Ambulatory Visit: Payer: Self-pay | Admitting: Family

## 2017-04-17 ENCOUNTER — Other Ambulatory Visit: Payer: Self-pay | Admitting: Family

## 2017-04-19 NOTE — Telephone Encounter (Signed)
Rx faxed

## 2017-04-30 IMAGING — DX DG SHOULDER 2+V*R*
3 series · 3 of 3 positions shown · non-contrast
Comparison: No prior.

CLINICAL DATA: Chronic pain.

EXAM:
RIGHT SHOULDER - 2+ VIEW

[grashey]
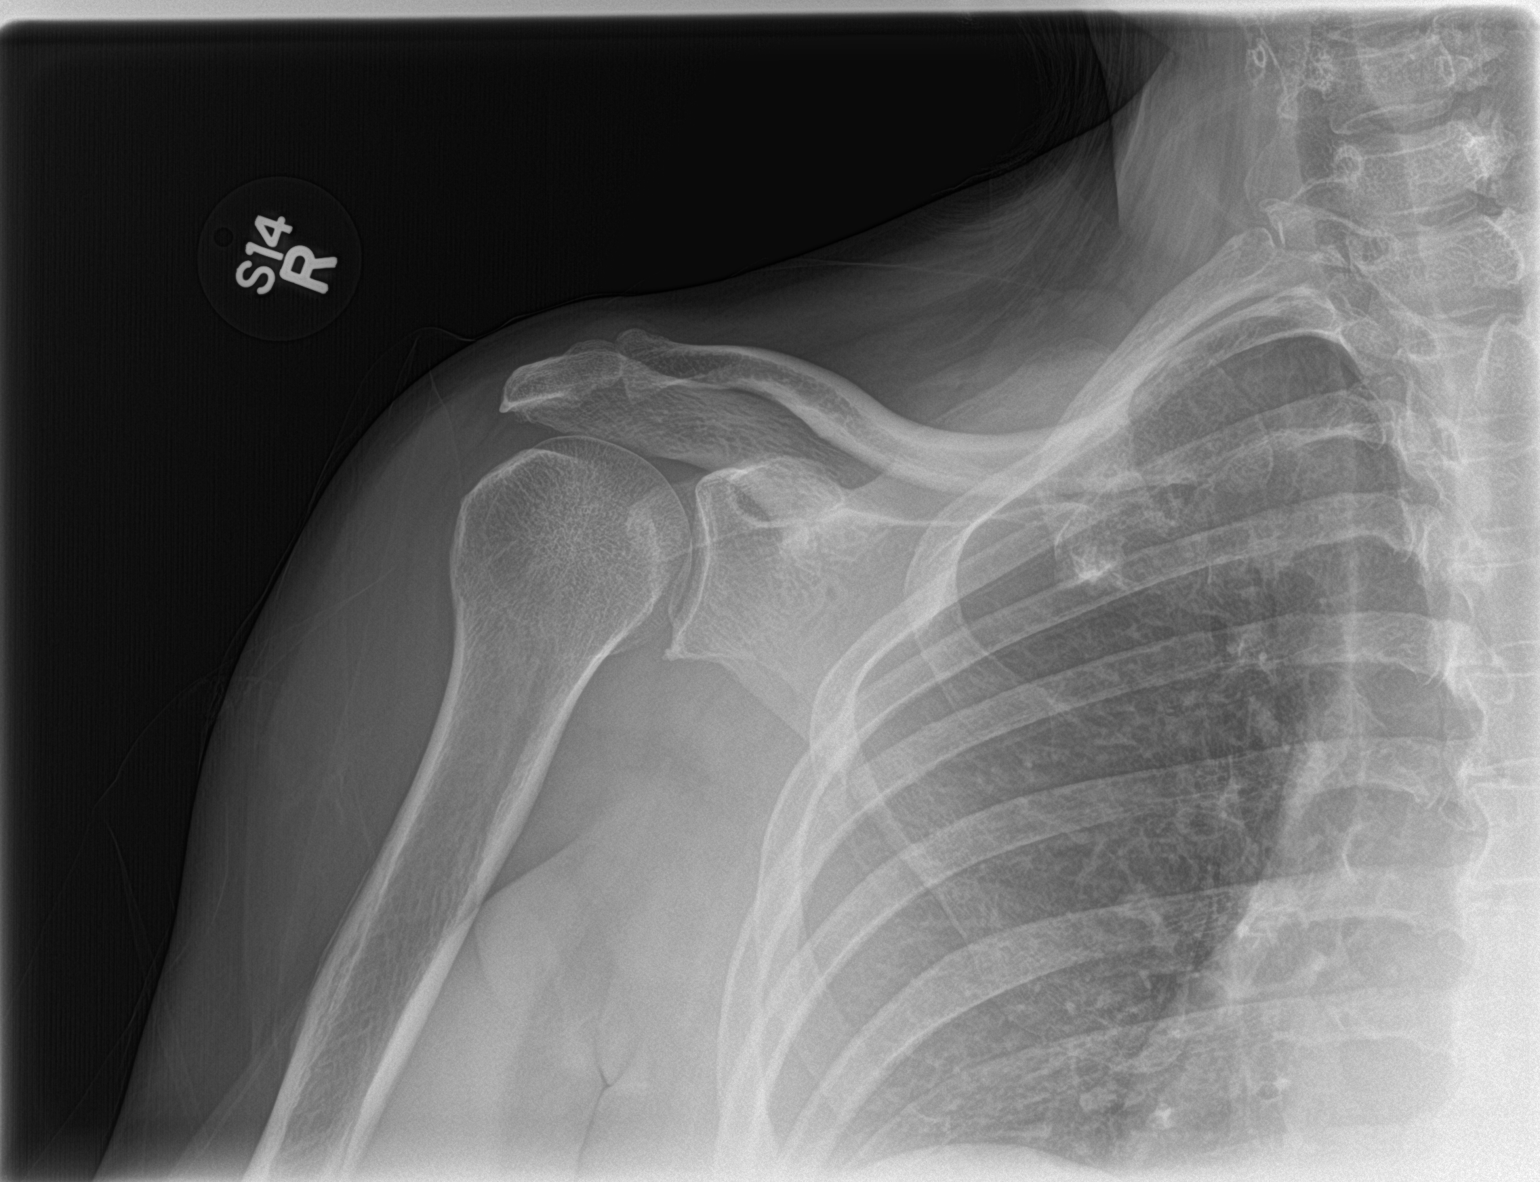

[y view]
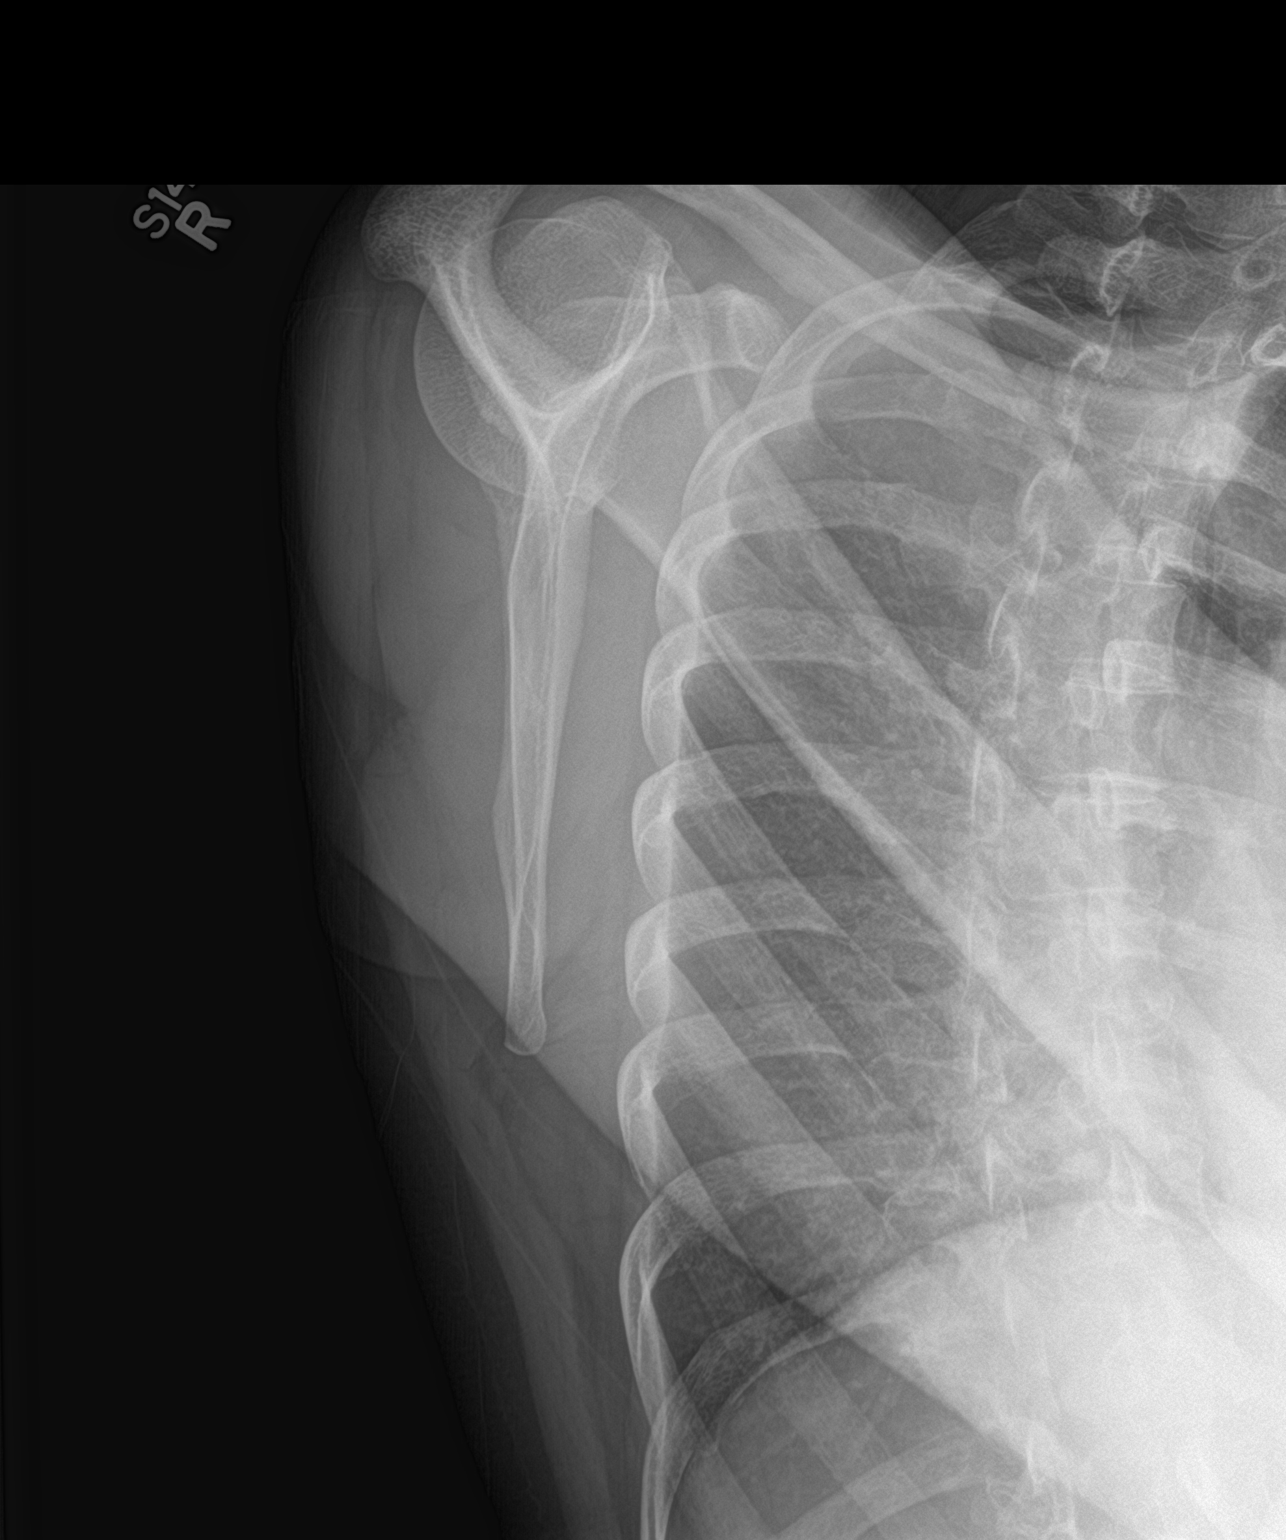

[shoulder axial]
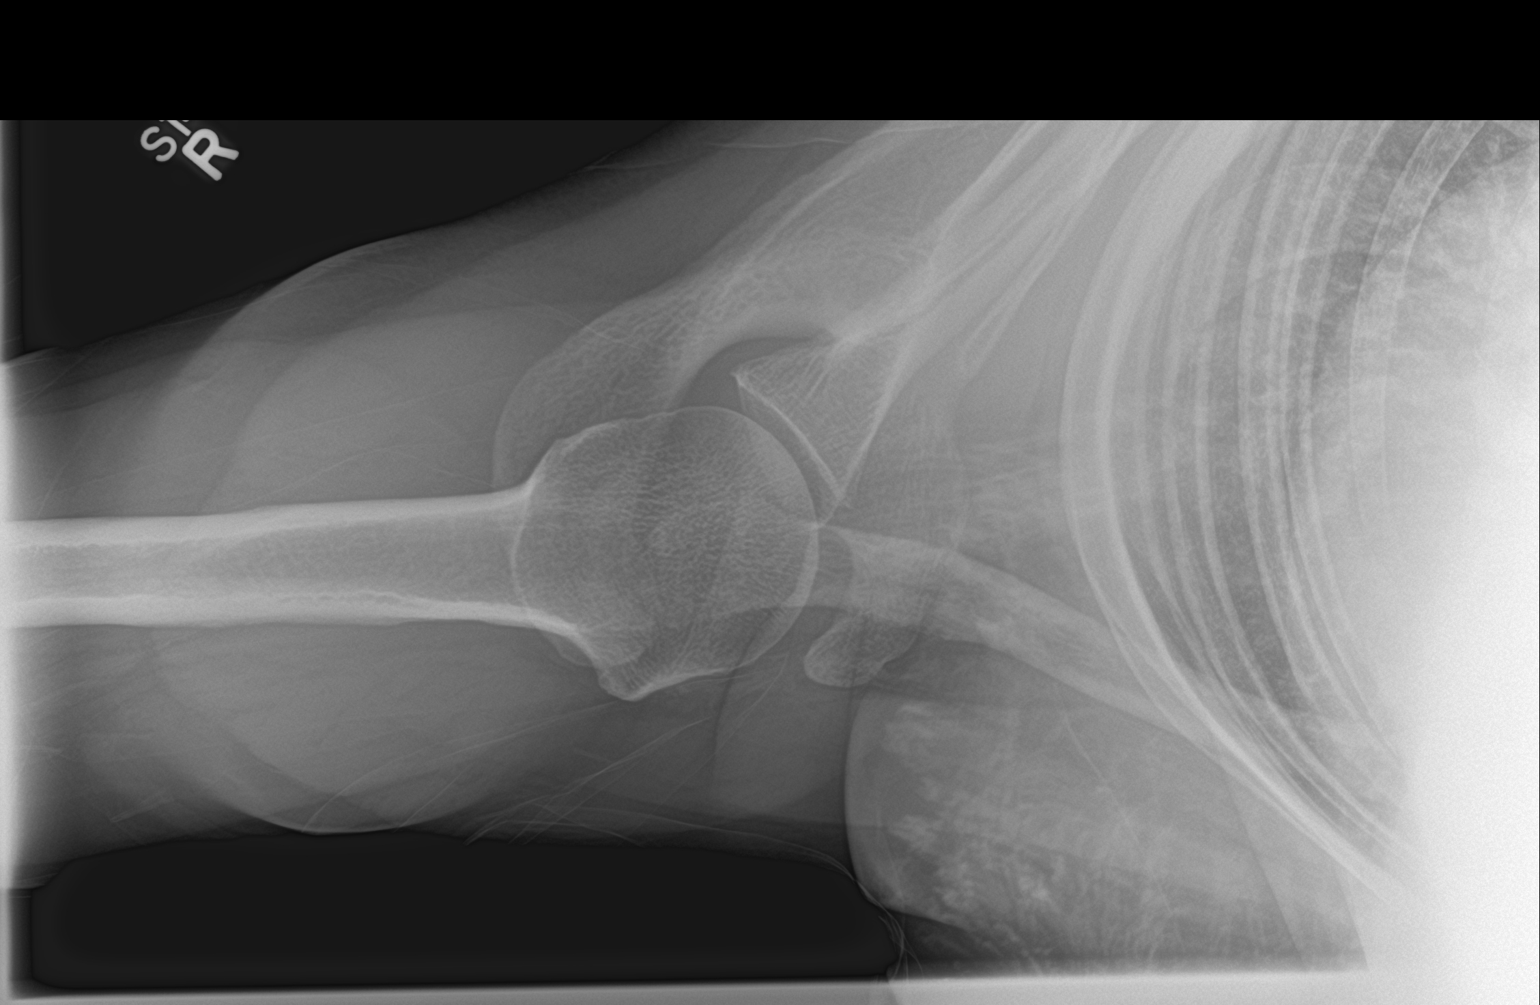

[3 of 3 positions shown; findings below may reference images not displayed]

FINDINGS: No acute bony or joint abnormality identified. Acromioclavicular and
glenohumeral degenerative change. No evidence of fracture or
dislocation.
IMPRESSION: Acromioclavicular and glenohumeral degenerative change. No acute
abnormality.

## 2017-06-11 ENCOUNTER — Other Ambulatory Visit: Payer: Self-pay | Admitting: Family

## 2017-06-12 ENCOUNTER — Other Ambulatory Visit: Payer: Self-pay | Admitting: Family

## 2017-06-21 ENCOUNTER — Other Ambulatory Visit: Payer: Self-pay | Admitting: Family

## 2017-06-21 ENCOUNTER — Encounter: Payer: Self-pay | Admitting: Family

## 2017-06-21 ENCOUNTER — Ambulatory Visit (INDEPENDENT_AMBULATORY_CARE_PROVIDER_SITE_OTHER): Payer: BLUE CROSS/BLUE SHIELD | Admitting: Family

## 2017-06-21 ENCOUNTER — Other Ambulatory Visit (INDEPENDENT_AMBULATORY_CARE_PROVIDER_SITE_OTHER): Payer: BLUE CROSS/BLUE SHIELD

## 2017-06-21 VITALS — BP 134/82 | HR 76 | Temp 98.9°F | Ht 66.0 in | Wt 143.0 lb

## 2017-06-21 DIAGNOSIS — Z720 Tobacco use: Secondary | ICD-10-CM

## 2017-06-21 DIAGNOSIS — I10 Essential (primary) hypertension: Secondary | ICD-10-CM

## 2017-06-21 DIAGNOSIS — Z1231 Encounter for screening mammogram for malignant neoplasm of breast: Secondary | ICD-10-CM | POA: Diagnosis not present

## 2017-06-21 DIAGNOSIS — M25512 Pain in left shoulder: Secondary | ICD-10-CM

## 2017-06-21 DIAGNOSIS — G4700 Insomnia, unspecified: Secondary | ICD-10-CM | POA: Diagnosis not present

## 2017-06-21 DIAGNOSIS — G8929 Other chronic pain: Secondary | ICD-10-CM | POA: Diagnosis not present

## 2017-06-21 LAB — CBC WITH DIFFERENTIAL/PLATELET
Basophils Absolute: 0 10*3/uL (ref 0.0–0.1)
Basophils Relative: 0.6 % (ref 0.0–3.0)
Eosinophils Absolute: 0.1 10*3/uL (ref 0.0–0.7)
Eosinophils Relative: 1 % (ref 0.0–5.0)
HCT: 35 % — ABNORMAL LOW (ref 36.0–46.0)
Hemoglobin: 11.7 g/dL — ABNORMAL LOW (ref 12.0–15.0)
Lymphocytes Relative: 27.9 % (ref 12.0–46.0)
Lymphs Abs: 2.1 10*3/uL (ref 0.7–4.0)
MCHC: 33.5 g/dL (ref 30.0–36.0)
MCV: 92 fl (ref 78.0–100.0)
Monocytes Absolute: 0.7 10*3/uL (ref 0.1–1.0)
Monocytes Relative: 8.9 % (ref 3.0–12.0)
Neutro Abs: 4.6 10*3/uL (ref 1.4–7.7)
Neutrophils Relative %: 61.6 % (ref 43.0–77.0)
Platelets: 228 10*3/uL (ref 150.0–400.0)
RBC: 3.8 Mil/uL — ABNORMAL LOW (ref 3.87–5.11)
RDW: 14.4 % (ref 11.5–15.5)
WBC: 7.5 10*3/uL (ref 4.0–10.5)

## 2017-06-21 LAB — COMPREHENSIVE METABOLIC PANEL
ALT: 17 U/L (ref 0–35)
AST: 16 U/L (ref 0–37)
Albumin: 4.7 g/dL (ref 3.5–5.2)
Alkaline Phosphatase: 53 U/L (ref 39–117)
BUN: 18 mg/dL (ref 6–23)
CO2: 26 mEq/L (ref 19–32)
Calcium: 9.4 mg/dL (ref 8.4–10.5)
Chloride: 106 mEq/L (ref 96–112)
Creatinine, Ser: 0.78 mg/dL (ref 0.40–1.20)
GFR: 98.18 mL/min (ref >60.00)
Glucose, Bld: 98 mg/dL (ref 70–99)
Potassium: 3.7 mEq/L (ref 3.5–5.1)
Sodium: 141 mEq/L (ref 135–145)
Total Bilirubin: 0.4 mg/dL (ref 0.2–1.2)
Total Protein: 7.9 g/dL (ref 6.0–8.3)

## 2017-06-21 MED ORDER — ESZOPICLONE 2 MG PO TABS: 2.0000 mg | ORAL_TABLET | Freq: Every evening | ORAL | 0 refills | Status: DC | PRN

## 2017-06-21 MED ORDER — VARENICLINE TARTRATE 0.5 MG PO TABS: 0.5000 mg | ORAL_TABLET | Freq: Two times a day (BID) | ORAL | 0 refills | Status: DC

## 2017-06-21 MED ORDER — LISINOPRIL-HYDROCHLOROTHIAZIDE 20-12.5 MG PO TABS: 2.0000 | ORAL_TABLET | Freq: Every day | ORAL | 2 refills | Status: DC

## 2017-06-21 NOTE — Progress Notes (Signed)
Jessica Lara is a 56 y.o. female with the following history as recorded in [employer]:  Patient Active Problem List   Diagnosis Date Noted  . Right shoulder pain 08/06/2015  . Sleep disturbance 08/06/2015  . Essential hypertension 08/06/2015  . Routine health maintenance 08/24/2013  . Laceration of neck 01/09/2012  . Duodenitis without mention of hemorrhage 01/09/2012  . Hyperglycemia 05/15/2011  . ANXIETY DEPRESSION 07/08/2010  . Other and unspecified hyperlipidemia 06/16/2010  . SMOKER 06/16/2010  . Unspecified essential hypertension 06/16/2010    Current Outpatient Medications  Medication Sig Dispense Refill  . DUEXIS 800-26.6 MG TABS TAKE 1 TABLET 3 TIMES A DAY AS NEEDED 90 tablet 1  . eszopiclone (LUNESTA) 2 MG TABS tablet Take 1 tablet (2 mg total) by mouth at bedtime as needed for sleep. Take immediately before bedtime 30 tablet 0  . lisinopril-hydrochlorothiazide (PRINZIDE,ZESTORETIC) 20-12.5 MG tablet Take 2 tablets by mouth daily. 60 tablet 2  . varenicline (CHANTIX) 0.5 MG tablet Take 1 tablet (0.5 mg total) by mouth 2 (two) times daily. 60 tablet 0   No current facility-administered medications for this visit.     Allergies: Patient has no known allergies.  Past Medical History:  Diagnosis Date  . ANXIETY DEPRESSION   . Chicken pox   . H/O: hysterectomy   . Hypertension   . Other and unspecified hyperlipidemia   . SMOKER   . Unspecified essential hypertension     Past Surgical History:  Procedure Laterality Date  . ABDOMINAL HYSTERECTOMY  1999   fibroids  . ESOPHAGOGASTRODUODENOSCOPY  01/09/2012   Procedure: ESOPHAGOGASTRODUODENOSCOPY (EGD);  Surgeon: Irene Shipper, MD;  Location: [facility] ENDOSCOPY;  Service: Endoscopy;  Laterality: N/A;  . single oopherectomy  1999    Family History  Problem Relation Age of Onset  . Diabetes Mother   . Hypertension Mother   . Hyperlipidemia Mother   . Stomach cancer Father   . Colon cancer Neg Hx   . Esophageal cancer Neg Hx    . Rectal cancer Neg Hx     Social History   Tobacco Use  . Smoking status: Current Every Day Smoker    Packs/day: 1.00    Years: 33.00    Pack years: 33.00    Types: Cigarettes  . Smokeless tobacco: Never Used  Substance Use Topics  . Alcohol use: Yes    Alcohol/week: 1.2 oz    Types: 2 Cans of beer per week    Subjective:  Patient presents to follow-up on her chronic care needs:  1) Hypertension- taking medication as prescribed; does not check regularly; Denies any chest pain, shortness of breath, blurred vision or headache 2) Insomnia- uses Lunesta 1 mg as needed; still having difficulty sleeping 3) Neck/ left shoulder pain- feels that has limited motion in her left shoulder; job is physical in nature and requires heavy lifting; also feels that pain from her neck radiates into her left shoulder 4) Tobacco Abuse- knows she needs to quit smoking; would like to discuss options   Objective:  Vitals:   06/21/17 1550  BP: 134/82  Pulse: 76  Temp: 98.9 F (37.2 C)  TempSrc: Oral  SpO2: 99%  Weight: 143 lb (64.9 kg)  Height: 5\' 6"  (1.676 m)    General: Well developed, well nourished, in no acute distress  Skin : Warm and dry.  Head: Normocephalic and atraumatic  Lungs: Respirations unlabored; clear to auscultation bilaterally without wheeze, rales, rhonchi  CVS exam: normal rate and regular rhythm.  Musculoskeletal: No deformities; no active joint inflammation; LROM left shoulder on active and passive motion due to pain  Extremities: No edema, cyanosis, clubbing  Vessels: Symmetric bilaterally  Neurologic: Alert and oriented; speech intact; face symmetrical; moves all extremities well; CNII-XII intact without focal deficit   Assessment:  1. Essential hypertension   2. Chronic left shoulder pain   3. Insomnia, unspecified type   4. Tobacco abuse   5. Visit for screening mammogram     Plan:  1. Stable; appears well controlled; refill on Lisinopril HCT; check CBC, CMP  today; 2. Refer to sports medicine; 3. Try increasing Lunesta to 2 mg qhs prn; 4. Enccouraged patient to remain committed to being smoke free; will give trial of Chanitx- starting dose prescribed; call back if she wants to continue prescription; 5. Mammogram updated;  Flu shot not given due to patient refusal; Encouraged to establish with new PCP and have CPE in the next 3 months;   Return in about 3 months (around 09/19/2017) for with Ashleigh for CPE.  Orders Placed This Encounter  Procedures  . MM Digital Screening    Standing Status:   Future    Standing Expiration Date:   08/22/2018    Order Specific Question:   Reason for Exam (SYMPTOM  OR DIAGNOSIS REQUIRED)    Answer:   screening mammogram    Order Specific Question:   Is the patient pregnant?    Answer:   No    Order Specific Question:   Preferred imaging location?    Answer:   [facility]  . Comprehensive metabolic panel    Standing Status:   Future    Number of Occurrences:   1    Standing Expiration Date:   06/21/2018  . CBC with Differential    Standing Status:   Future    Number of Occurrences:   1    Standing Expiration Date:   06/21/2018  . Ambulatory referral to Sports Medicine    Referral Priority:   Routine    Referral Type:   Consultation    Number of Visits Requested:   1    Requested Prescriptions   Signed Prescriptions Disp Refills  . lisinopril-hydrochlorothiazide (PRINZIDE,ZESTORETIC) 20-12.5 MG tablet 60 tablet 2    Sig: Take 2 tablets by mouth daily.  . eszopiclone (LUNESTA) 2 MG TABS tablet 30 tablet 0    Sig: Take 1 tablet (2 mg total) by mouth at bedtime as needed for sleep. Take immediately before bedtime  . varenicline (CHANTIX) 0.5 MG tablet 60 tablet 0    Sig: Take 1 tablet (0.5 mg total) by mouth 2 (two) times daily.

## 2017-06-21 NOTE — Patient Instructions (Signed)
Steps to Quit Smoking Smoking tobacco can be bad for your health. It can also affect almost every organ in your body. Smoking puts you and people around you at risk for many serious long-lasting (chronic) diseases. Quitting smoking is hard, but it is one of the best things that you can do for your health. It is never too late to quit. What are the benefits of quitting smoking? When you quit smoking, you lower your risk for getting serious diseases and conditions. They can include:  Lung cancer or lung disease.  Heart disease.  Stroke.  Heart attack.  Not being able to have children (infertility).  Weak bones (osteoporosis) and broken bones (fractures).  If you have coughing, wheezing, and shortness of breath, those symptoms may get better when you quit. You may also get sick less often. If you are pregnant, quitting smoking can help to lower your chances of having a baby of low birth weight. What can I do to help me quit smoking? Talk with your doctor about what can help you quit smoking. Some things you can do (strategies) include:  Quitting smoking totally, instead of slowly cutting back how much you smoke over a period of time.  Going to in-person counseling. You are more likely to quit if you go to many counseling sessions.  Using resources and support systems, such as: ? Online chats with a counselor. ? Phone quitlines. ? Printed self-help materials. ? Support groups or group counseling. ? Text messaging programs. ? Mobile phone apps or applications.  Taking medicines. Some of these medicines may have nicotine in them. If you are pregnant or breastfeeding, do not take any medicines to quit smoking unless your doctor says it is okay. Talk with your doctor about counseling or other things that can help you.  Talk with your doctor about using more than one strategy at the same time, such as taking medicines while you are also going to in-person counseling. This can help make  quitting easier. What things can I do to make it easier to quit? Quitting smoking might feel very hard at first, but there is a lot that you can do to make it easier. Take these steps:  Talk to your family and friends. Ask them to support and encourage you.  Call phone quitlines, reach out to support groups, or work with a counselor.  Ask people who smoke to not smoke around you.  Avoid places that make you want (trigger) to smoke, such as: ? Bars. ? Parties. ? Smoke-break areas at work.  Spend time with people who do not smoke.  Lower the stress in your life. Stress can make you want to smoke. Try these things to help your stress: ? Getting regular exercise. ? Deep-breathing exercises. ? Yoga. ? Meditating. ? Doing a body scan. To do this, close your eyes, focus on one area of your body at a time from head to toe, and notice which parts of your body are tense. Try to relax the muscles in those areas.  Download or buy apps on your mobile phone or tablet that can help you stick to your quit plan. There are many free apps, such as QuitGuide from the CDC (Centers for Disease Control and Prevention). You can find more support from smokefree.gov and other websites.  This information is not intended to replace advice given to you by your health care provider. Make sure you discuss any questions you have with your health care provider. Document Released: 04/18/2009 Document   Revised: 02/18/2016 Document Reviewed: 11/06/2014 Elsevier Interactive Patient Education  2018 Elsevier Inc.  

## 2017-06-22 ENCOUNTER — Other Ambulatory Visit: Payer: Self-pay | Admitting: Family

## 2017-06-22 DIAGNOSIS — M25511 Pain in right shoulder: Secondary | ICD-10-CM

## 2017-06-30 ENCOUNTER — Other Ambulatory Visit: Payer: Self-pay | Admitting: Family

## 2017-06-30 DIAGNOSIS — M25511 Pain in right shoulder: Secondary | ICD-10-CM

## 2017-07-12 NOTE — Progress Notes (Signed)
[facility] [address], [place] [address] Phone: [phone] Subjective: Referred by nurse practitioner Clayton Lefort  CC: Left shoulder pain  RDE:[id number]  Jessica Lara is a 57 y.o. female coming in with complaint of left shoulder pain.She has been having pain for about 2 years. She has pain over the anterior portion of her shoulder. She has pain that radiates into the arm down to the elbow. Patient works at [employer] so she uses her arms a lot. At night she has a hard time lying on the left side due to the pain. She has tried topical analegisc, IBU, tylenol, and ice. Pain character is sharp but also can be dull at rest.   Onset- 2 years Location- left shoulder Duration- constant Character- sharp and dull Aggravating factors- overhead movements Reliving factors- not moving her arm Therapies tried-  Ice, IBU, tylenol, topical analgesic Severity-     Past Medical History:  Diagnosis Date  . ANXIETY DEPRESSION   . Chicken pox   . H/O: hysterectomy   . Hypertension   . Other and unspecified hyperlipidemia   . SMOKER   . Unspecified essential hypertension    Past Surgical History:  Procedure Laterality Date  . ABDOMINAL HYSTERECTOMY  1999   fibroids  . ESOPHAGOGASTRODUODENOSCOPY  01/09/2012   Procedure: ESOPHAGOGASTRODUODENOSCOPY (EGD);  Surgeon: Irene Shipper, MD;  Location: [facility] ENDOSCOPY;  Service: Endoscopy;  Laterality: N/A;  . single oopherectomy  1999   Social History   Socioeconomic History  . Marital status: Single    Spouse name: Not on file  . Number of children: 1  . Years of education: 36  . Highest education level: Not on file  Social Needs  . Financial resource strain: Not on file  . Food insecurity - worry: Not on file  . Food insecurity - inability: Not on file  . Transportation needs - medical: Not on file  . Transportation needs - non-medical: Not on file  Occupational History  . Occupation: Corporate treasurer    Tobacco Use  . Smoking status: Current Every Day Smoker    Packs/day: 1.00    Years: 33.00    Pack years: 33.00    Types: Cigarettes  . Smokeless tobacco: Never Used  Substance and Sexual Activity  . Alcohol use: Yes    Alcohol/week: 1.2 oz    Types: 2 Cans of beer per week  . Drug use: No  . Sexual activity: Yes    Birth control/protection: None  Other Topics Concern  . Not on file  Social History Narrative   Fun: Watch TV   Denies abuse and feels safe at home.    No Known Allergies Family History  Problem Relation Age of Onset  . Diabetes Mother   . Hypertension Mother   . Hyperlipidemia Mother   . Stomach cancer Father   . Colon cancer Neg Hx   . Esophageal cancer Neg Hx   . Rectal cancer Neg Hx      Past medical history, social, surgical and family history all reviewed in electronic medical record.  No pertanent information unless stated regarding to the chief complaint.   Review of Systems:Review of systems updated and as accurate as of 07/12/17  No headache, visual changes, nausea, vomiting, diarrhea, constipation, dizziness, abdominal pain, skin rash, fevers, chills, night sweats, weight loss, swollen lymph nodes, body aches, joint swelling, muscle aches, chest pain, shortness of breath, mood changes.   Objective  There were no  vitals taken for this visit. Systems examined below as of 07/12/17   General: No apparent distress alert and oriented x3 mood and affect normal, dressed appropriately.  HEENT: Pupils equal, extraocular movements intact  Respiratory: Patient's speak in full sentences and does not appear short of breath  Cardiovascular: No lower extremity edema, non tender, no erythema  Skin: Warm dry intact with no signs of infection or rash on extremities or on axial skeleton.  Abdomen: Soft nontender  Neuro: Cranial nerves II through XII are intact, neurovascularly intact in all extremities with 2+ DTRs and 2+ pulses.  Lymph: No lymphadenopathy of  posterior or anterior cervical chain or axillae bilaterally.  Gait normal with good balance and coordination.  MSK:  Non tender with full range of motion and good stability and symmetric strength and tone of  elbows, wrist, hip, knee and ankles bilaterally.   Neck exam shows significant decrease in range of motion in all planes. Crepitus noted. Positive Spurling's with radicular symptoms on the left side. Mild weakness of 4 out of 5 strength of the C7 distribution otherwise unremarkable on exam.  Shoulder: left Inspection reveals no abnormalities, atrophy or asymmetry. Palpation is normal with no tenderness over AC joint or bicipital groove. ROM is full in all planes passively. Rotator cuff strength normal throughout. signs of impingement with positive Neer and Hawkin's tests, but negative empty can sign. Speeds and Yergason's tests normal. No labral pathology noted with negative Obrien's, negative clunk and good stability. Normal scapular function observed. No painful arc and no drop arm sign. No apprehension sign Contralateral shoulder unremarkable  MSK US performed of: left This study was ordered, performed, and interpreted by Charlann Boxer D.O.  Shoulder:   Supraspinatus:  Appears normal on long and transverse views, Bursal bulge seen with shoulder abduction on impingement view. Infraspinatus:  Appears normal on long and transverse views. Significant increase in Doppler flow Subscapularis:  Appears normal on long and transverse views. Positive bursa Teres Minor:  Appears normal on long and transverse views. AC joint:  Capsule undistended, no geyser sign. Glenohumeral Joint:  Appears normal without effusion. Glenoid Labrum:  Intact without visualized tears. Biceps Tendon:  Appears normal on long and transverse views, no fraying of tendon, tendon located in intertubercular groove, no subluxation with shoulder internal or external rotation.  Impression: Subacromial bursitis  mild   97110; 15 additional minutes spent for Therapeutic exercises as stated in above notes.  This included exercises focusing on stretching, strengthening, with significant focus on eccentric aspects.   Long term goals include an improvement in range of motion, strength, endurance as well as avoiding reinjury. Patient's frequency would include in 1-2 times a day, 3-5 times a week for a duration of 6-12 weeks.  Shoulder Exercises that included:  Basic scapular stabilization to include adduction and depression of scapula Scaption, focusing on proper movement and good control Internal and External rotation utilizing a theraband, with elbow tucked at side entire time Rows with theraband unremarkable Proper technique shown and discussed handout in great detail with ATC.  All questions were discussed and answered.      Impression and Recommendations:     This case required medical decision making of moderate complexity.      Note: This dictation was prepared with Dragon dictation along with smaller phrase technology. Any transcriptional errors that result from this process are unintentional.

## 2017-07-13 ENCOUNTER — Ambulatory Visit: Payer: Self-pay

## 2017-07-13 ENCOUNTER — Ambulatory Visit (INDEPENDENT_AMBULATORY_CARE_PROVIDER_SITE_OTHER): Payer: BLUE CROSS/BLUE SHIELD | Admitting: Family Medicine

## 2017-07-13 ENCOUNTER — Encounter: Payer: Self-pay | Admitting: Family Medicine

## 2017-07-13 VITALS — BP 132/58 | HR 84 | Ht 66.0 in | Wt 143.0 lb

## 2017-07-13 DIAGNOSIS — M25512 Pain in left shoulder: Secondary | ICD-10-CM

## 2017-07-13 DIAGNOSIS — M5412 Radiculopathy, cervical region: Secondary | ICD-10-CM

## 2017-07-13 MED ORDER — DICLOFENAC SODIUM 2 % TD SOLN: 2.0000 g | Freq: Two times a day (BID) | TRANSDERMAL | 3 refills | Status: DC

## 2017-07-13 MED ORDER — GABAPENTIN 100 MG PO CAPS: 200.0000 mg | ORAL_CAPSULE | Freq: Every day | ORAL | 3 refills | Status: DC

## 2017-07-13 NOTE — Patient Instructions (Addendum)
Good to see you.  Ice 20 minutes 2 times daily. Usually after activity and before bed. Exercises 3 times a week.  Gabapentin 200mg  at night pennsaid pinkie amount topically 2 times daily as needed. Will come from another pharmacy  Lifting limit at work.  Over the counter 2000 IU dialy of vitamin D See me again in 3-4

## 2017-07-13 NOTE — Assessment & Plan Note (Signed)
Patient does have some cervical radicular pain. We discussed icing regimen and home exercises. We discussed objective is to do which ones to avoid. Increase activity as tolerated. Work with Product/process development scientist. Started gabapentin at night. Follow-up with me again in 3-4 weeks

## 2017-07-23 ENCOUNTER — Inpatient Hospital Stay: Admission: RE | Admit: 2017-07-23 | Payer: BLUE CROSS/BLUE SHIELD | Source: Ambulatory Visit

## 2017-08-11 ENCOUNTER — Ambulatory Visit: Payer: BLUE CROSS/BLUE SHIELD | Admitting: Family Medicine

## 2017-08-24 ENCOUNTER — Other Ambulatory Visit: Payer: Self-pay | Admitting: Family

## 2017-09-16 ENCOUNTER — Other Ambulatory Visit: Payer: Self-pay | Admitting: Family

## 2017-09-20 ENCOUNTER — Encounter: Payer: Self-pay | Admitting: Nurse Practitioner

## 2017-09-20 ENCOUNTER — Ambulatory Visit (INDEPENDENT_AMBULATORY_CARE_PROVIDER_SITE_OTHER): Payer: BLUE CROSS/BLUE SHIELD | Admitting: Nurse Practitioner

## 2017-09-20 VITALS — BP 130/62 | HR 80 | Temp 99.4°F | Resp 16 | Ht 66.0 in | Wt 136.8 lb

## 2017-09-20 DIAGNOSIS — F341 Dysthymic disorder: Secondary | ICD-10-CM

## 2017-09-20 DIAGNOSIS — G4700 Insomnia, unspecified: Secondary | ICD-10-CM

## 2017-09-20 DIAGNOSIS — Z1239 Encounter for other screening for malignant neoplasm of breast: Secondary | ICD-10-CM

## 2017-09-20 DIAGNOSIS — I10 Essential (primary) hypertension: Secondary | ICD-10-CM | POA: Diagnosis not present

## 2017-09-20 DIAGNOSIS — Z1231 Encounter for screening mammogram for malignant neoplasm of breast: Secondary | ICD-10-CM

## 2017-09-20 MED ORDER — LISINOPRIL-HYDROCHLOROTHIAZIDE 20-12.5 MG PO TABS: 2.0000 | ORAL_TABLET | Freq: Every day | ORAL | 2 refills | Status: DC

## 2017-09-20 MED ORDER — ESZOPICLONE 2 MG PO TABS: 2.0000 mg | ORAL_TABLET | Freq: Every evening | ORAL | 1 refills | Status: DC | PRN

## 2017-09-20 MED ORDER — ESCITALOPRAM OXALATE 10 MG PO TABS: 10.0000 mg | ORAL_TABLET | Freq: Every day | ORAL | 1 refills | Status: DC

## 2017-09-20 NOTE — Patient Instructions (Addendum)
Call the [facility] [phone] to schedule your mammogram. Let me know if any trouble scheduling this.   I have sent a prescription for lexapro 10mg  tablets to your pharmacy. Please start 1/2 tablet once daily for 1 week and then increase to a full tablet once daily on week two as tolerated.  Some side effects such as nausea, drowsiness and weight gain can occur.  Also rarely people have experienced suicidal thoughts when taking this medication.  Please discontinue the medication and go directly to ED if this occurs.  Id like to see you back in about 1 month to evaluate progress.    It was good to meet you today. Thanks for letting me take care of you today :)   Mindfulness-Based Stress Reduction Mindfulness-based stress reduction (MBSR) is a program that helps people learn to practice mindfulness. Mindfulness is the practice of intentionally paying attention to the present moment. It can be learned and practiced through techniques such as education, breathing exercises, meditation, and yoga. MBSR includes several mindfulness techniques in one program. MBSR works best when you understand the treatment, are willing to try new things, and can commit to spending time practicing what you learn. MBSR training may include learning about:  How your emotions, thoughts, and reactions affect your body.  New ways to respond to things that cause negative thoughts to start (triggers).  How to notice your thoughts and let go of them.  Practicing awareness of everyday things that you normally do without thinking.  The techniques and goals of different types of meditation.  What are the benefits of MBSR? MBSR can have many benefits, which include helping you to:  Develop self-awareness. This refers to knowing and understanding yourself.  Learn skills and attitudes that help you to participate in your own health care.  Learn new ways to care for yourself.  Be more accepting about how things are,  and let things go.  Be less judgmental and approach things with an open mind.  Be patient with yourself and trust yourself more.  MBSR has also been shown to:  Reduce negative emotions, such as depression and anxiety.  Improve memory and focus.  Change how you sense and approach pain.  Boost your body's ability to fight infections.  Help you connect better with other people.  Improve your sense of well-being.  Follow these instructions at home:  Find a local in-person or online MBSR program.  Set aside some time regularly for mindfulness practice.  Find a mindfulness practice that works best for you. This may include one or more of the following: ? Meditation. Meditation involves focusing your mind on a certain thought or activity. ? Breathing awareness exercises. These help you to stay present by focusing on your breath. ? Body scan. For this practice, you lie down and pay attention to each part of your body from head to toe. You can identify tension and soreness and intentionally relax parts of your body. ? Yoga. Yoga involves stretching and breathing, and it can improve your ability to move and be flexible. It can also provide an experience of testing your body's limits, which can help you release stress. ? Mindful eating. This way of eating involves focusing on the taste, texture, color, and smell of each bite of food. Because this slows down eating and helps you feel full sooner, it can be an important part of a weight-loss plan.  Find a podcast or recording that provides guidance for breathing awareness, body scan, or  meditation exercises. You can listen to these any time when you have a free moment to rest without distractions.  Follow your treatment plan as told by your health care provider. This may include taking regular medicines and making changes to your diet or lifestyle as recommended. How to practice mindfulness To do a basic awareness exercise:  Find a  comfortable place to sit.  Pay attention to the present moment. Observe your thoughts, feelings, and surroundings just as they are.  Avoid placing judgment on yourself, your feelings, or your surroundings. Make note of any judgment that comes up, and let it go.  Your mind may wander, and that is okay. Make note of when your thoughts drift, and return your attention to the present moment.  To do basic mindfulness meditation:  Find a comfortable place to sit. This may include a stable chair or a firm floor cushion. ? Sit upright with your back straight. Let your arms fall next to your side with your hands resting on your legs. ? If sitting in a chair, rest your feet flat on the floor. ? If sitting on a cushion, cross your legs in front of you.  Keep your head in a neutral position with your chin dropped slightly. Relax your jaw and rest the tip of your tongue on the roof of your mouth. Drop your gaze to the floor. You can close your eyes if you like.  Breathe normally and pay attention to your breath. Feel the air moving in and out of your nose. Feel your belly expanding and relaxing with each breath.  Your mind may wander, and that is okay. Make note of when your thoughts drift, and return your attention to your breath.  Avoid placing judgment on yourself, your feelings, or your surroundings. Make note of any judgment or feelings that come up, let them go, and bring your attention back to your breath.  When you are ready, lift your gaze or open your eyes. Pay attention to how your body feels after the meditation.  Where to find more information: You can find more information about MBSR from:  Your health care provider.  Community-based meditation centers or programs.  Programs offered near you.  Summary  Mindfulness-based stress reduction (MBSR) is a program that teaches you how to intentionally pay attention to the present moment. It is used with other treatments to help you cope  better with daily stress, emotions, and pain.  MBSR focuses on developing self-awareness, which allows you to respond to life stress without judgment or negative emotions.  MBSR programs may involve learning different mindfulness practices, such as breathing exercises, meditation, yoga, body scan, or mindful eating. Find a mindfulness practice that works best for you, and set aside time for it on a regular basis. This information is not intended to replace advice given to you by your health care provider. Make sure you discuss any questions you have with your health care provider. Document Released: 10/29/2016 Document Revised: 10/29/2016 Document Reviewed: 10/29/2016 Elsevier Interactive Patient Education  [employer].

## 2017-09-20 NOTE — Progress Notes (Signed)
Name: Jessica Lara   MRN: [id number]    DOB: 08/09/1960   Date:09/20/2017       Progress Note  Subjective  Chief Complaint  Chief Complaint  Patient presents with  . Establish Care    routine follow up, needs mammogram    HPI Jessica Lara is establishing care with me today, transferring from another provider who left our clinic. She would like to get refills of her lunesta and lisinopril-HCTZ today.  Hypertension -maintained on lisinopril-HCTZ 20-12.5- 2 tablets daily. Reports daily medication compliance without adverse medication effects. Reports she does not check her blood pressure routinely at home. Denies headaches, vision changes, chest pain, shortness of breath, edema.  BP Readings from Last 3 Encounters:  09/20/17 130/62  07/13/17 (!) 132/58  06/21/17 134/82   Insomnia-maintained on lunesta- dosage was increased to 2mg  qhs prn in December  She continues to have Difficulty sleeping every night even on new dosage- was getting about 1-2 hours a night before the lunesta, now gets about 4 hours of sleep She feels tired all afternoon but can not go to sleep at night She reports lying awake worrying, not every night but at least several nights a week -there Is so much on her mind she can not sleep. She says she often cries and feels depressed. She denies any thoughts of hurting herself or anyone else but says that she often gets easily irritated and has thought of fighting people when she was angry, but would not use a weapon or intentionally try to hurt someone. She is tearful today. She says she was actually on a medication for anxiety over ten years ago but stopped taking it because she didn't like the way it made her feel. She has also been to counseling for her mood before but did not find it helpful.  Patient Active Problem List   Diagnosis Date Noted  . Cervical radiculopathy at C7 07/13/2017  . Right shoulder pain 08/06/2015  . Sleep disturbance 08/06/2015  .  Essential hypertension 08/06/2015  . Routine health maintenance 08/24/2013  . Laceration of neck 01/09/2012  . Duodenitis without mention of hemorrhage 01/09/2012  . Hyperglycemia 05/15/2011  . ANXIETY DEPRESSION 07/08/2010  . Other and unspecified hyperlipidemia 06/16/2010  . SMOKER 06/16/2010  . Unspecified essential hypertension 06/16/2010    Past Surgical History:  Procedure Laterality Date  . ABDOMINAL HYSTERECTOMY  1999   fibroids  . ESOPHAGOGASTRODUODENOSCOPY  01/09/2012   Procedure: ESOPHAGOGASTRODUODENOSCOPY (EGD);  Surgeon: Jessica Shipper, MD;  Location: [facility] ENDOSCOPY;  Service: Endoscopy;  Laterality: N/A;  . single oopherectomy  1999    Family History  Problem Relation Age of Onset  . Diabetes Mother   . Hypertension Mother   . Hyperlipidemia Mother   . Stomach cancer Father   . Colon cancer Neg Hx   . Esophageal cancer Neg Hx   . Rectal cancer Neg Hx     Social History   Socioeconomic History  . Marital status: Single    Spouse name: Not on file  . Number of children: 1  . Years of education: 3  . Highest education level: Not on file  Social Needs  . Financial resource strain: Not on file  . Food insecurity - worry: Not on file  . Food insecurity - inability: Not on file  . Transportation needs - medical: Not on file  . Transportation needs - non-medical: Not on file  Occupational History  . Occupation: Corporate treasurer  Tobacco Use  .  Smoking status: Current Every Day Smoker    Packs/day: 1.00    Years: 33.00    Pack years: 33.00    Types: Cigarettes  . Smokeless tobacco: Never Used  Substance and Sexual Activity  . Alcohol use: Yes    Alcohol/week: 1.2 oz    Types: 2 Cans of beer per week  . Drug use: No  . Sexual activity: Yes    Birth control/protection: None  Other Topics Concern  . Not on file  Social History Narrative   Fun: Watch TV   Denies abuse and feels safe at home.      Current Outpatient Medications:  .  Diclofenac  Sodium 2 % SOLN, Place 2 g onto the skin 2 (two) times daily., Disp: 112 g, Rfl: 3 .  DUEXIS 800-26.6 MG TABS, TAKE 1 TABLET 3 TIMES A DAY AS NEEDED, Disp: 90 tablet, Rfl: 1 .  eszopiclone (LUNESTA) 2 MG TABS tablet, Take 1 tablet (2 mg total) by mouth at bedtime as needed for sleep. Take immediately before bedtime, Disp: 30 tablet, Rfl: 1 .  gabapentin (NEURONTIN) 100 MG capsule, Take 2 capsules (200 mg total) by mouth at bedtime., Disp: 60 capsule, Rfl: 3 .  lisinopril-hydrochlorothiazide (PRINZIDE,ZESTORETIC) 20-12.5 MG tablet, Take 2 tablets by mouth daily., Disp: 60 tablet, Rfl: 2 .  escitalopram (LEXAPRO) 10 MG tablet, Take 1 tablet (10 mg total) by mouth daily., Disp: 30 tablet, Rfl: 1  No Known Allergies   ROS See HPI  Objective  Vitals:   09/20/17 1505  BP: 130/62  Pulse: 80  Resp: 16  Temp: 99.4 F (37.4 C)  TempSrc: Oral  SpO2: 97%  Weight: 136 lb 12.8 oz (62.1 kg)  Height: 5\' 6"  (1.676 m)   Body mass index is 22.08 kg/m.  Physical Exam Vital signs reviewed. Constitutional: Patient appears well-developed and well-nourished. No distress.  HENT: Head: Normocephalic and atraumatic. Nose: Nose normal. Mouth/Throat: Oropharynx is clear and moist. Eyes: Conjunctivae and EOM are normal. Pupils are equal, round, and reactive to light. No scleral icterus.  Neck: Normal range of motion. Neck supple.  Cardiovascular: Normal rate, regular rhythm and normal heart sounds. No BLE edema. Distal pulses intact. Pulmonary/Chest: Effort normal and breath sounds normal. No respiratory distress. Neurological:  She is alert and oriented to person, place, and time. No cranial nerve deficit. Coordination, balance, strength, speech and gait are normal.  Skin: Skin is warm and dry. No rash noted. No erythema.  Psychiatric: Patient has a normal affect. Patient is anxious.  Assessment & Plan RTC in 1 month for F/U of anxiety and depression, insomnia- starting lexapro  -Reviewed Health  Maintenance: Screening for breast cancer Requesting mammogram today This order was placed in December and remains active I have provided her with phone number for [place], she will let me know if any trouble scheduling the mammogram

## 2017-09-26 DIAGNOSIS — G4700 Insomnia, unspecified: Secondary | ICD-10-CM | POA: Insufficient documentation

## 2017-09-26 NOTE — Assessment & Plan Note (Signed)
Will continue lunesta for now We will trial lexapro today for anxiety and depression with goal of improving sleep and overall quality of life. RTC in 1 month for follow up Controlled substance registry reviewed with no irregularities. UDS up to date. - eszopiclone (LUNESTA) 2 MG TABS tablet; Take 1 tablet (2 mg total) by mouth at bedtime as needed for sleep. Take immediately before bedtime  Dispense: 30 tablet; Refill: 1

## 2017-09-26 NOTE — Assessment & Plan Note (Signed)
Declines counseling Discussed initiating SSRI for uncontrolled anxiety and depression-lexapro dosing and side effects were discussed- I've explained to her that drugs of the SSRI class can have side effects such as weight gain, sexual dysfunction, insomnia, headache, nausea. These medications are generally effective at alleviating symptoms of anxiety and/or depression. Let me know if significant side effects do occur. Also discussed nonpharm options for managing stress and anxiety at home-See AVS for additional information provided to patient - escitalopram (LEXAPRO) 10 MG tablet; Take 1 tablet (10 mg total) by mouth daily.  Dispense: 30 tablet; Refill: 1

## 2017-09-26 NOTE — Assessment & Plan Note (Signed)
Stable, continue current meds CBC and CMP WNL on 06/21/17 - lisinopril-hydrochlorothiazide (PRINZIDE,ZESTORETIC) 20-12.5 MG tablet; Take 2 tablets by mouth daily.  Dispense: 60 tablet; Refill: 2

## 2017-10-04 ENCOUNTER — Other Ambulatory Visit: Payer: Self-pay | Admitting: Family Medicine

## 2017-10-29 ENCOUNTER — Ambulatory Visit (INDEPENDENT_AMBULATORY_CARE_PROVIDER_SITE_OTHER): Payer: BLUE CROSS/BLUE SHIELD | Admitting: Nurse Practitioner

## 2017-10-29 ENCOUNTER — Encounter: Payer: Self-pay | Admitting: Nurse Practitioner

## 2017-10-29 VITALS — BP 134/70 | HR 72 | Resp 16 | Ht 66.0 in | Wt 136.6 lb

## 2017-10-29 DIAGNOSIS — F341 Dysthymic disorder: Secondary | ICD-10-CM | POA: Diagnosis not present

## 2017-10-29 MED ORDER — VENLAFAXINE HCL 75 MG PO TABS: 75.0000 mg | ORAL_TABLET | Freq: Every day | ORAL | 1 refills | Status: DC

## 2017-10-29 MED ORDER — VENLAFAXINE HCL 75 MG PO TABS: 75.0000 mg | ORAL_TABLET | Freq: Two times a day (BID) | ORAL | 1 refills | Status: DC

## 2017-10-29 NOTE — Progress Notes (Signed)
Name: Jessica Lara   MRN: [id number]    DOB: 07/18/1960   Date:10/29/2017       Progress Note  Subjective  Chief Complaint  Chief Complaint  Patient presents with  . Follow-up    anxiety    HPI  Anxiety- She was started on lexapro 10 daily at her last office visit to help with anxiety and sleep. She has started the lexapro which she feels helps calm her down but she continues to feel anxious, moody and irritable daily. She has not noted any adverse effects to the lexapro. She had an episode at work last week where she became so upset that she began to feel like she could not breathe and got lightheaded. The symptoms resolved after deep breathing and have not returned.  She said she got upset about work stress and personal issues She says she is still having trouble sleeping but is now getting about 5-6 hours a night instead of 3-4. She denies thoughts of hurting herself or others   Patient Active Problem List   Diagnosis Date Noted  . Insomnia 09/26/2017  . Cervical radiculopathy at C7 07/13/2017  . Sleep disturbance 08/06/2015  . Essential hypertension 08/06/2015  . Routine health maintenance 08/24/2013  . Laceration of neck 01/09/2012  . Duodenitis without mention of hemorrhage 01/09/2012  . Hyperglycemia 05/15/2011  . ANXIETY DEPRESSION 07/08/2010  . Other and unspecified hyperlipidemia 06/16/2010  . SMOKER 06/16/2010    Past Surgical History:  Procedure Laterality Date  . ABDOMINAL HYSTERECTOMY  1999   fibroids  . ESOPHAGOGASTRODUODENOSCOPY  01/09/2012   Procedure: ESOPHAGOGASTRODUODENOSCOPY (EGD);  Surgeon: Irene Shipper, MD;  Location: [facility] ENDOSCOPY;  Service: Endoscopy;  Laterality: N/A;  . single oopherectomy  1999    Family History  Problem Relation Age of Onset  . Diabetes Mother   . Hypertension Mother   . Hyperlipidemia Mother   . Stomach cancer Father   . Colon cancer Neg Hx   . Esophageal cancer Neg Hx   . Rectal cancer Neg Hx     Social History    Socioeconomic History  . Marital status: Single    Spouse name: Not on file  . Number of children: 1  . Years of education: 57  . Highest education level: Not on file  Occupational History  . Occupation: Corporate treasurer  Social Needs  . Financial resource strain: Not on file  . Food insecurity:    Worry: Not on file    Inability: Not on file  . Transportation needs:    Medical: Not on file    Non-medical: Not on file  Tobacco Use  . Smoking status: Current Every Day Smoker    Packs/day: 1.00    Years: 33.00    Pack years: 33.00    Types: Cigarettes  . Smokeless tobacco: Never Used  Substance and Sexual Activity  . Alcohol use: Yes    Alcohol/week: 1.2 oz    Types: 2 Cans of beer per week  . Drug use: No  . Sexual activity: Yes    Birth control/protection: None  Lifestyle  . Physical activity:    Days per week: Not on file    Minutes per session: Not on file  . Stress: Not on file  Relationships  . Social connections:    Talks on phone: Not on file    Gets together: Not on file    Attends religious service: Not on file    Active member of club or  organization: Not on file    Attends meetings of clubs or organizations: Not on file    Relationship status: Not on file  . Intimate partner violence:    Fear of current or ex partner: Not on file    Emotionally abused: Not on file    Physically abused: Not on file    Forced sexual activity: Not on file  Other Topics Concern  . Not on file  Social History Narrative   Fun: Watch TV   Denies abuse and feels safe at home.      Current Outpatient Medications:  .  Diclofenac Sodium 2 % SOLN, Place 2 g onto the skin 2 (two) times daily., Disp: 112 g, Rfl: 3 .  DUEXIS 800-26.6 MG TABS, TAKE 1 TABLET 3 TIMES A DAY AS NEEDED, Disp: 90 tablet, Rfl: 1 .  eszopiclone (LUNESTA) 2 MG TABS tablet, Take 1 tablet (2 mg total) by mouth at bedtime as needed for sleep. Take immediately before bedtime, Disp: 30 tablet, Rfl: 1 .   gabapentin (NEURONTIN) 100 MG capsule, TAKE 2 CAPSULES (200 MG TOTAL) BY MOUTH AT BEDTIME., Disp: 60 capsule, Rfl: 2 .  lisinopril-hydrochlorothiazide (PRINZIDE,ZESTORETIC) 20-12.5 MG tablet, Take 2 tablets by mouth daily., Disp: 60 tablet, Rfl: 2 .  venlafaxine (EFFEXOR) 75 MG tablet, Take 1 tablet (75 mg total) by mouth daily., Disp: 30 tablet, Rfl: 1  No Known Allergies   ROS See HPI  Objective  Vitals:   10/29/17 1355  BP: 134/70  Pulse: 72  Resp: 16  SpO2: 98%  Weight: 136 lb 9.6 oz (62 kg)  Height: 5\' 6"  (1.676 m)   Body mass index is 22.05 kg/m.  Physical Exam Vital signs reviewed. Constitutional: Patient appears well-developed and well-nourished. No distress.  HENT: Head: Normocephalic and atraumatic. Nose: Nose normal. Mouth/Throat: Oropharynx is clear and moist. Eyes: Conjunctivae and EOM are normal. Pupils are equal, round, and reactive to light. No scleral icterus.  Neck: Normal range of motion. Neck supple.  Cardiovascular: Normal rate, regular rhythm and normal heart sounds. No BLE edema. Distal pulses intact. Pulmonary/Chest: Effort normal and breath sounds normal. No respiratory distress. Neurological:  She is alert and oriented to person, place, and time. Coordination, balance, strength, speech and gait are normal.  Skin: Skin is warm and dry. No rash noted. No erythema.  Psychiatric: Patient has a normal affect. Patient is anxious.   Assessment & Plan RTC in 1 month for F/U: Anxiety-starting effexor; CPE if stable  1. ANXIETY DEPRESSION Discussed increasing dosage of lexapro for her continued symptoms of anxiety but she would like to try a different medication- will stop lexapro and start effexor to see if this is more effective for her- dosing and side effects discussed Also discussed nonpharmacological methods to manage anxiety and stress and printed information in AVS continues to decline counseling F/u in 1 month - venlafaxine (EFFEXOR) 75 MG tablet;  Take 1 tablet (75 mg total) by mouth daily.  Dispense: 30 tablet; Refill: 1

## 2017-10-29 NOTE — Patient Instructions (Addendum)
Please stop the lexapro. Please start effexor 75 mg once daily. Please return in about 1 month so I can see how you are doing.  Thanks for letting me take care of you today :)    Mindfulness-Based Stress Reduction Mindfulness-based stress reduction (MBSR) is a program that helps people learn to practice mindfulness. Mindfulness is the practice of intentionally paying attention to the present moment. It can be learned and practiced through techniques such as education, breathing exercises, meditation, and yoga. MBSR includes several mindfulness techniques in one program. MBSR works best when you understand the treatment, are willing to try new things, and can commit to spending time practicing what you learn. MBSR training may include learning about:  How your emotions, thoughts, and reactions affect your body.  New ways to respond to things that cause negative thoughts to start (triggers).  How to notice your thoughts and let go of them.  Practicing awareness of everyday things that you normally do without thinking.  The techniques and goals of different types of meditation.  What are the benefits of MBSR? MBSR can have many benefits, which include helping you to:  Develop self-awareness. This refers to knowing and understanding yourself.  Learn skills and attitudes that help you to participate in your own health care.  Learn new ways to care for yourself.  Be more accepting about how things are, and let things go.  Be less judgmental and approach things with an open mind.  Be patient with yourself and trust yourself more.  MBSR has also been shown to:  Reduce negative emotions, such as depression and anxiety.  Improve memory and focus.  Change how you sense and approach pain.  Boost your body's ability to fight infections.  Help you connect better with other people.  Improve your sense of well-being.  Follow these instructions at home:  Find a local in-person or  online MBSR program.  Set aside some time regularly for mindfulness practice.  Find a mindfulness practice that works best for you. This may include one or more of the following: ? Meditation. Meditation involves focusing your mind on a certain thought or activity. ? Breathing awareness exercises. These help you to stay present by focusing on your breath. ? Body scan. For this practice, you lie down and pay attention to each part of your body from head to toe. You can identify tension and soreness and intentionally relax parts of your body. ? Yoga. Yoga involves stretching and breathing, and it can improve your ability to move and be flexible. It can also provide an experience of testing your body's limits, which can help you release stress. ? Mindful eating. This way of eating involves focusing on the taste, texture, color, and smell of each bite of food. Because this slows down eating and helps you feel full sooner, it can be an important part of a weight-loss plan.  Find a podcast or recording that provides guidance for breathing awareness, body scan, or meditation exercises. You can listen to these any time when you have a free moment to rest without distractions.  Follow your treatment plan as told by your health care provider. This may include taking regular medicines and making changes to your diet or lifestyle as recommended. How to practice mindfulness To do a basic awareness exercise:  Find a comfortable place to sit.  Pay attention to the present moment. Observe your thoughts, feelings, and surroundings just as they are.  Avoid placing judgment on yourself, your feelings,  or your surroundings. Make note of any judgment that comes up, and let it go.  Your mind may wander, and that is okay. Make note of when your thoughts drift, and return your attention to the present moment.  To do basic mindfulness meditation:  Find a comfortable place to sit. This may include a stable chair or  a firm floor cushion. ? Sit upright with your back straight. Let your arms fall next to your side with your hands resting on your legs. ? If sitting in a chair, rest your feet flat on the floor. ? If sitting on a cushion, cross your legs in front of you.  Keep your head in a neutral position with your chin dropped slightly. Relax your jaw and rest the tip of your tongue on the roof of your mouth. Drop your gaze to the floor. You can close your eyes if you like.  Breathe normally and pay attention to your breath. Feel the air moving in and out of your nose. Feel your belly expanding and relaxing with each breath.  Your mind may wander, and that is okay. Make note of when your thoughts drift, and return your attention to your breath.  Avoid placing judgment on yourself, your feelings, or your surroundings. Make note of any judgment or feelings that come up, let them go, and bring your attention back to your breath.  When you are ready, lift your gaze or open your eyes. Pay attention to how your body feels after the meditation.  Where to find more information: You can find more information about MBSR from:  Your health care provider.  Community-based meditation centers or programs.  Programs offered near you.  Summary  Mindfulness-based stress reduction (MBSR) is a program that teaches you how to intentionally pay attention to the present moment. It is used with other treatments to help you cope better with daily stress, emotions, and pain.  MBSR focuses on developing self-awareness, which allows you to respond to life stress without judgment or negative emotions.  MBSR programs may involve learning different mindfulness practices, such as breathing exercises, meditation, yoga, body scan, or mindful eating. Find a mindfulness practice that works best for you, and set aside time for it on a regular basis. This information is not intended to replace advice given to you by your health care  provider. Make sure you discuss any questions you have with your health care provider. Document Released: 10/29/2016 Document Revised: 10/29/2016 Document Reviewed: 10/29/2016 Elsevier Interactive Patient Education  [employer].

## 2017-11-13 ENCOUNTER — Other Ambulatory Visit: Payer: Self-pay | Admitting: Nurse Practitioner

## 2017-11-13 DIAGNOSIS — F341 Dysthymic disorder: Secondary | ICD-10-CM

## 2017-11-18 ENCOUNTER — Other Ambulatory Visit: Payer: Self-pay | Admitting: Nurse Practitioner

## 2017-11-18 DIAGNOSIS — F341 Dysthymic disorder: Secondary | ICD-10-CM

## 2017-11-19 MED ORDER — ESCITALOPRAM OXALATE 10 MG PO TABS: 10.0000 mg | ORAL_TABLET | Freq: Every day | ORAL | 1 refills | Status: DC

## 2017-11-19 NOTE — Addendum Note (Signed)
Addended by: Earnstine Regal on: 11/19/2017 01:34 PM   Modules accepted: Orders

## 2017-11-20 ENCOUNTER — Other Ambulatory Visit: Payer: Self-pay | Admitting: Nurse Practitioner

## 2017-11-20 DIAGNOSIS — F341 Dysthymic disorder: Secondary | ICD-10-CM

## 2017-12-01 ENCOUNTER — Ambulatory Visit: Payer: BLUE CROSS/BLUE SHIELD | Admitting: Nurse Practitioner

## 2017-12-10 ENCOUNTER — Other Ambulatory Visit: Payer: Self-pay | Admitting: Family Medicine

## 2017-12-10 NOTE — Telephone Encounter (Signed)
Refill done.  

## 2017-12-12 ENCOUNTER — Other Ambulatory Visit: Payer: Self-pay | Admitting: Nurse Practitioner

## 2017-12-12 DIAGNOSIS — I10 Essential (primary) hypertension: Secondary | ICD-10-CM

## 2018-01-18 ENCOUNTER — Ambulatory Visit: Payer: Self-pay | Admitting: *Deleted

## 2018-01-18 ENCOUNTER — Other Ambulatory Visit (INDEPENDENT_AMBULATORY_CARE_PROVIDER_SITE_OTHER): Payer: BLUE CROSS/BLUE SHIELD

## 2018-01-18 ENCOUNTER — Encounter: Payer: Self-pay | Admitting: Internal Medicine

## 2018-01-18 ENCOUNTER — Ambulatory Visit (INDEPENDENT_AMBULATORY_CARE_PROVIDER_SITE_OTHER): Payer: BLUE CROSS/BLUE SHIELD | Admitting: Family

## 2018-01-18 ENCOUNTER — Encounter: Payer: Self-pay | Admitting: Family

## 2018-01-18 VITALS — BP 140/72 | HR 92 | Temp 98.3°F | Ht 66.0 in | Wt 136.6 lb

## 2018-01-18 DIAGNOSIS — G4700 Insomnia, unspecified: Secondary | ICD-10-CM | POA: Diagnosis not present

## 2018-01-18 DIAGNOSIS — K625 Hemorrhage of anus and rectum: Secondary | ICD-10-CM | POA: Diagnosis not present

## 2018-01-18 LAB — CBC WITH DIFFERENTIAL/PLATELET
Basophils Absolute: 0 10*3/uL (ref 0.0–0.1)
Basophils Relative: 0.6 % (ref 0.0–3.0)
Eosinophils Absolute: 0.1 10*3/uL (ref 0.0–0.7)
Eosinophils Relative: 0.9 % (ref 0.0–5.0)
HCT: 35.1 % — ABNORMAL LOW (ref 36.0–46.0)
Hemoglobin: 12 g/dL (ref 12.0–15.0)
Lymphocytes Relative: 29 % (ref 12.0–46.0)
Lymphs Abs: 1.8 10*3/uL (ref 0.7–4.0)
MCHC: 34.2 g/dL (ref 30.0–36.0)
MCV: 91.7 fl (ref 78.0–100.0)
Monocytes Absolute: 0.6 10*3/uL (ref 0.1–1.0)
Monocytes Relative: 9.8 % (ref 3.0–12.0)
Neutro Abs: 3.7 10*3/uL (ref 1.4–7.7)
Neutrophils Relative %: 59.7 % (ref 43.0–77.0)
Platelets: 245 10*3/uL (ref 150.0–400.0)
RBC: 3.83 Mil/uL — ABNORMAL LOW (ref 3.87–5.11)
RDW: 13.9 % (ref 11.5–15.5)
WBC: 6.2 10*3/uL (ref 4.0–10.5)

## 2018-01-18 LAB — FERRITIN: Ferritin: 224.7 ng/mL (ref 10.0–291.0)

## 2018-01-18 MED ORDER — ESZOPICLONE 2 MG PO TABS: 2.0000 mg | ORAL_TABLET | Freq: Every evening | ORAL | 1 refills | Status: DC | PRN

## 2018-01-18 MED ORDER — HYDROCORTISONE ACETATE 25 MG RE SUPP: 25.0000 mg | Freq: Two times a day (BID) | RECTAL | 0 refills | Status: DC

## 2018-01-18 MED ORDER — LOSARTAN POTASSIUM-HCTZ 100-25 MG PO TABS: 1.0000 | ORAL_TABLET | Freq: Every day | ORAL | 0 refills | Status: DC

## 2018-01-18 NOTE — Telephone Encounter (Signed)
Last colonoscopy 2-3 years ago- patient does have history of hemorrhoids- but she does not think that is occurring. No pain in the rectal area.   Reason for Disposition . MODERATE rectal bleeding (small blood clots, passing blood without stool, or toilet water turns red)  Answer Assessment - Initial Assessment Questions 1. APPEARANCE of BLOOD: "What color is it?" "Is it passed separately, on the surface of the stool, or mixed in with the stool?"      Bright red-mixed in with stool 2. AMOUNT: "How much blood was passed?"      A lot- more than tablespoon  3. FREQUENCY: "How many times has blood been passed with the stools?"      Every other day she is seeing blood in stool 4. ONSET: "When was the blood first seen in the stools?" (Days or weeks)      1 month 5. DIARRHEA: "Is there also some diarrhea?" If so, ask: "How many diarrhea stools were passed in past 24 hours?"      no 6. CONSTIPATION: "Do you have constipation?" If so, "How bad is it?"     no 7. RECURRENT SYMPTOMS: "Have you had blood in your stools before?" If so, ask: "When was the last time?" and "What happened that time?"      no 8. BLOOD THINNERS: "Do you take any blood thinners?" (e.g., Coumadin/warfarin, Pradaxa/dabigatran, aspirin)     no 9. OTHER SYMPTOMS: "Do you have any other symptoms?"  (e.g., abdominal pain, vomiting, dizziness, fever)     no 10. PREGNANCY: "Is there any chance you are pregnant?" "When was your last menstrual period?"       n/a  Protocols used: RECTAL BLEEDING-A-AH

## 2018-01-18 NOTE — Progress Notes (Signed)
Jessica Lara is a 57 y.o. female with the following history as recorded in [employer]:  Patient Active Problem List   Diagnosis Date Noted  . Insomnia 09/26/2017  . Cervical radiculopathy at C7 07/13/2017  . Sleep disturbance 08/06/2015  . Essential hypertension 08/06/2015  . Routine health maintenance 08/24/2013  . Laceration of neck 01/09/2012  . Duodenitis without mention of hemorrhage 01/09/2012  . Hyperglycemia 05/15/2011  . ANXIETY DEPRESSION 07/08/2010  . Other and unspecified hyperlipidemia 06/16/2010  . SMOKER 06/16/2010    Current Outpatient Medications  Medication Sig Dispense Refill  . Diclofenac Sodium 2 % SOLN Place 2 g onto the skin 2 (two) times daily. 112 g 3  . DUEXIS 800-26.6 MG TABS TAKE 1 TABLET 3 TIMES A DAY AS NEEDED 90 tablet 1  . eszopiclone (LUNESTA) 2 MG TABS tablet Take 1 tablet (2 mg total) by mouth at bedtime as needed for sleep. Take immediately before bedtime 30 tablet 1  . gabapentin (NEURONTIN) 100 MG capsule TAKE 2 CAPSULES (200 MG TOTAL) BY MOUTH AT BEDTIME. 180 capsule 1  . venlafaxine (EFFEXOR) 75 MG tablet TAKE 1 TABLET BY MOUTH EVERY DAY 90 tablet 1  . hydrocortisone (ANUSOL-HC) 25 MG suppository Place 1 suppository (25 mg total) rectally 2 (two) times daily. 12 suppository 0  . losartan-hydrochlorothiazide (HYZAAR) 100-25 MG tablet Take 1 tablet by mouth daily. 90 tablet 0   No current facility-administered medications for this visit.     Allergies: Lisinopril  Past Medical History:  Diagnosis Date  . ANXIETY DEPRESSION   . Chicken pox   . H/O: hysterectomy   . Hypertension   . Other and unspecified hyperlipidemia   . SMOKER   . Unspecified essential hypertension     Past Surgical History:  Procedure Laterality Date  . ABDOMINAL HYSTERECTOMY  1999   fibroids  . ESOPHAGOGASTRODUODENOSCOPY  01/09/2012   Procedure: ESOPHAGOGASTRODUODENOSCOPY (EGD);  Surgeon: Irene Shipper, MD;  Location: [facility] ENDOSCOPY;  Service: Endoscopy;  Laterality:  N/A;  . single oopherectomy  1999    Family History  Problem Relation Age of Onset  . Diabetes Mother   . Hypertension Mother   . Hyperlipidemia Mother   . Stomach cancer Father   . Colon cancer Neg Hx   . Esophageal cancer Neg Hx   . Rectal cancer Neg Hx     Social History   Tobacco Use  . Smoking status: Current Every Day Smoker    Packs/day: 1.00    Years: 33.00    Pack years: 33.00    Types: Cigarettes  . Smokeless tobacco: Never Used  Substance Use Topics  . Alcohol use: Yes    Alcohol/week: 1.2 oz    Types: 2 Cans of beer per week    Subjective:  1 month history of rectal bleeding; notes that symptoms are worsening in the past week; does have history of hemorrhoids but notes this sensation is very different; denies any abdominal pain or straining; no obvious triggers; job does involve heavy lifting; became concerned with the amount of blood she has been seeing; no nausea or vomiting;  Also mentions concerns about persistent dry cough; no concerns for lips or throat swelling;   Objective:  Vitals:   01/18/18 1516  BP: 140/72  Pulse: 92  Temp: 98.3 F (36.8 C)  TempSrc: Oral  SpO2: 98%  Weight: 136 lb 9.6 oz (62 kg)  Height: 5\' 6"  (1.676 m)    General: Well developed, well nourished, in no acute distress  Skin : Warm and dry.  Head: Normocephalic and atraumatic  Lungs: Respirations unlabored; clear to auscultation bilaterally without wheeze, rales, rhonchi  CVS exam: normal rate and regular rhythm.  Neurologic: Alert and oriented; speech intact; face symmetrical; moves all extremities well; CNII-XII intact without focal deficit  Rectal exam- limited exam but no obvious abnormality seen;  Assessment:  1. Rectal bleeding   2. Insomnia, unspecified type     Plan:  1. Update referral to GI; check CBC today; trial of Anusol HC; ? If hemorrhoids- patient's job involves heavy lifting; 2. Refill on Lunesta; 3. Suspect ACE induced cough; d/c Lisinopril and change  to Losartan HCT; follow-up for re-check in 3 weeks.   Return in about 3 weeks (around 02/08/2018) for with Ashleigh or me for blood pressure check.  Orders Placed This Encounter  Procedures  . CBC w/Diff    Standing Status:   Future    Number of Occurrences:   1    Standing Expiration Date:   01/18/2019  . Ferritin    Standing Status:   Future    Number of Occurrences:   1    Standing Expiration Date:   01/18/2019  . Iron and TIBC    Standing Status:   Future    Number of Occurrences:   1    Standing Expiration Date:   01/19/2019  . Ambulatory referral to Gastroenterology    Referral Priority:   Routine    Referral Type:   Consultation    Referral Reason:   Specialty Services Required    Number of Visits Requested:   1    Requested Prescriptions   Signed Prescriptions Disp Refills  . losartan-hydrochlorothiazide (HYZAAR) 100-25 MG tablet 90 tablet 0    Sig: Take 1 tablet by mouth daily.  . eszopiclone (LUNESTA) 2 MG TABS tablet 30 tablet 1    Sig: Take 1 tablet (2 mg total) by mouth at bedtime as needed for sleep. Take immediately before bedtime  . hydrocortisone (ANUSOL-HC) 25 MG suppository 12 suppository 0    Sig: Place 1 suppository (25 mg total) rectally 2 (two) times daily.

## 2018-01-19 LAB — IRON AND TIBC
Iron Saturation: 16 % (ref 15–55)
Iron: 59 ug/dL (ref 27–159)
Total Iron Binding Capacity: 371 ug/dL (ref 250–450)
UIBC: 312 ug/dL (ref 131–425)

## 2018-02-09 ENCOUNTER — Ambulatory Visit (INDEPENDENT_AMBULATORY_CARE_PROVIDER_SITE_OTHER): Payer: BLUE CROSS/BLUE SHIELD | Admitting: Nurse Practitioner

## 2018-02-09 ENCOUNTER — Encounter: Payer: Self-pay | Admitting: Nurse Practitioner

## 2018-02-09 ENCOUNTER — Other Ambulatory Visit (INDEPENDENT_AMBULATORY_CARE_PROVIDER_SITE_OTHER): Payer: BLUE CROSS/BLUE SHIELD

## 2018-02-09 VITALS — BP 146/68 | HR 92 | Temp 98.5°F | Resp 16 | Ht 66.0 in | Wt 141.0 lb

## 2018-02-09 DIAGNOSIS — R05 Cough: Secondary | ICD-10-CM | POA: Diagnosis not present

## 2018-02-09 DIAGNOSIS — I10 Essential (primary) hypertension: Secondary | ICD-10-CM | POA: Diagnosis not present

## 2018-02-09 DIAGNOSIS — R059 Cough, unspecified: Secondary | ICD-10-CM

## 2018-02-09 LAB — BASIC METABOLIC PANEL
BUN: 15 mg/dL (ref 6–23)
CO2: 26 mEq/L (ref 19–32)
Calcium: 9.9 mg/dL (ref 8.4–10.5)
Chloride: 103 mEq/L (ref 96–112)
Creatinine, Ser: 0.92 mg/dL (ref 0.40–1.20)
GFR: 80.97 mL/min (ref >60.00)
Glucose, Bld: 98 mg/dL (ref 70–99)
Potassium: 3.8 mEq/L (ref 3.5–5.1)
Sodium: 137 mEq/L (ref 135–145)

## 2018-02-09 NOTE — Patient Instructions (Addendum)
Please head downstairs for lab work. If any of your test results are critically abnormal, you will be contacted right away. Otherwise, I will contact you within a week about your test results and any recommendations for abnormalities.  Please return in about 3 months, we can recheck your blood pressure and do your annual physical with fasting lab work.  It was good to see you. Thanks for letting me take care of you today.   Hypertension Hypertension is another name for high blood pressure. High blood pressure forces your heart to work harder to pump blood. This can cause problems over time. There are two numbers in a blood pressure reading. There is a top number (systolic) over a bottom number (diastolic). It is best to have a blood pressure below 120/80. Healthy choices can help lower your blood pressure. You may need medicine to help lower your blood pressure if:  Your blood pressure cannot be lowered with healthy choices.  Your blood pressure is higher than 130/80.  Follow these instructions at home: Eating and drinking  If directed, follow the DASH eating plan. This diet includes: ? Filling half of your plate at each meal with fruits and vegetables. ? Filling one quarter of your plate at each meal with whole grains. Whole grains include whole wheat pasta, brown rice, and whole grain bread. ? Eating or drinking low-fat dairy products, such as skim milk or low-fat yogurt. ? Filling one quarter of your plate at each meal with low-fat (lean) proteins. Low-fat proteins include fish, skinless chicken, eggs, beans, and tofu. ? Avoiding fatty meat, cured and processed meat, or chicken with skin. ? Avoiding premade or processed food.  Eat less than 1,500 mg of salt (sodium) a day.  Limit alcohol use to no more than 1 drink a day for nonpregnant women and 2 drinks a day for men. One drink equals 12 oz of beer, 5 oz of wine, or 1 oz of hard liquor. Lifestyle  Work with your doctor to stay  at a healthy weight or to lose weight. Ask your doctor what the best weight is for you.  Get at least 30 minutes of exercise that causes your heart to beat faster (aerobic exercise) most days of the week. This may include walking, swimming, or biking.  Get at least 30 minutes of exercise that strengthens your muscles (resistance exercise) at least 3 days a week. This may include lifting weights or pilates.  Do not use any products that contain nicotine or tobacco. This includes cigarettes and e-cigarettes. If you need help quitting, ask your doctor.  Check your blood pressure at home as told by your doctor.  Keep all follow-up visits as told by your doctor. This is important. Medicines  Take over-the-counter and prescription medicines only as told by your doctor. Follow directions carefully.  Do not skip doses of blood pressure medicine. The medicine does not work as well if you skip doses. Skipping doses also puts you at risk for problems.  Ask your doctor about side effects or reactions to medicines that you should watch for. Contact a doctor if:  You think you are having a reaction to the medicine you are taking.  You have headaches that keep coming back (recurring).  You feel dizzy.  You have swelling in your ankles.  You have trouble with your vision. Get help right away if:  You get a very bad headache.  You start to feel confused.  You feel weak or numb.  You  feel faint.  You get very bad pain in your: ? Chest. ? Belly (abdomen).  You throw up (vomit) more than once.  You have trouble breathing. Summary  Hypertension is another name for high blood pressure.  Making healthy choices can help lower blood pressure. If your blood pressure cannot be controlled with healthy choices, you may need to take medicine. This information is not intended to replace advice given to you by your health care provider. Make sure you discuss any questions you have with your health  care provider. Document Released: 12/09/2007 Document Revised: 05/20/2016 Document Reviewed: 05/20/2016 Elsevier Interactive Patient Education  [employer].

## 2018-02-09 NOTE — Progress Notes (Signed)
Name: Jessica Lara   MRN: [id number]    DOB: [birth year]   Date:02/09/2018       Progress Note  Subjective  Chief Complaint  Chief Complaint  Patient presents with  . Follow-up    blood pressure per Mickel Baas    HPI Ms Ulatowski is here today for blood pressure follow up. Her blood pressure medication was changed from lisinopril-HCTZ to losartan-HCTZ 100-25 daily at last OV on 01/18/18 due to a cough. Reports daily medication compliance without noted adverse medication effects and reports her cough seems to be improving since stopping the lisinopril, cough is almost gone now. Reports she does not routinely check her BP readings at home Denies headaches, vision changes, chest pain, shortness of breath, edema, overall says she feels well today  BP Readings from Last 3 Encounters:  02/09/18 (!) 146/68  01/18/18 140/72  10/29/17 134/70    Patient Active Problem List   Diagnosis Date Noted  . Insomnia 09/26/2017  . Cervical radiculopathy at C7 07/13/2017  . Sleep disturbance 08/06/2015  . Essential hypertension 08/06/2015  . Routine health maintenance 08/24/2013  . Laceration of neck 01/09/2012  . Duodenitis without mention of hemorrhage 01/09/2012  . Hyperglycemia 05/15/2011  . ANXIETY DEPRESSION 07/08/2010  . Other and unspecified hyperlipidemia 06/16/2010  . SMOKER 06/16/2010    Past Surgical History:  Procedure Laterality Date  . ABDOMINAL HYSTERECTOMY  1999   fibroids  . ESOPHAGOGASTRODUODENOSCOPY  01/09/2012   Procedure: ESOPHAGOGASTRODUODENOSCOPY (EGD);  Surgeon: Irene Shipper, MD;  Location: [facility] ENDOSCOPY;  Service: Endoscopy;  Laterality: N/A;  . single oopherectomy  1999    Family History  Problem Relation Age of Onset  . Diabetes Mother   . Hypertension Mother   . Hyperlipidemia Mother   . Stomach cancer Father   . Colon cancer Neg Hx   . Esophageal cancer Neg Hx   . Rectal cancer Neg Hx     Social History   Socioeconomic History  . Marital status: Single   Spouse name: Not on file  . Number of children: 1  . Years of education: 58  . Highest education level: Not on file  Occupational History  . Occupation: Corporate treasurer  Social Needs  . Financial resource strain: Not on file  . Food insecurity:    Worry: Not on file    Inability: Not on file  . Transportation needs:    Medical: Not on file    Non-medical: Not on file  Tobacco Use  . Smoking status: Current Every Day Smoker    Packs/day: 1.00    Years: 33.00    Pack years: 33.00    Types: Cigarettes  . Smokeless tobacco: Never Used  Substance and Sexual Activity  . Alcohol use: Yes    Alcohol/week: 1.2 oz    Types: 2 Cans of beer per week  . Drug use: No  . Sexual activity: Yes    Birth control/protection: None  Lifestyle  . Physical activity:    Days per week: Not on file    Minutes per session: Not on file  . Stress: Not on file  Relationships  . Social connections:    Talks on phone: Not on file    Gets together: Not on file    Attends religious service: Not on file    Active member of club or organization: Not on file    Attends meetings of clubs or organizations: Not on file    Relationship status: Not on file  .  Intimate partner violence:    Fear of current or ex partner: Not on file    Emotionally abused: Not on file    Physically abused: Not on file    Forced sexual activity: Not on file  Other Topics Concern  . Not on file  Social History Narrative   Fun: Watch TV   Denies abuse and feels safe at home.      Current Outpatient Medications:  .  Diclofenac Sodium 2 % SOLN, Place 2 g onto the skin 2 (two) times daily., Disp: 112 g, Rfl: 3 .  DUEXIS 800-26.6 MG TABS, TAKE 1 TABLET 3 TIMES A DAY AS NEEDED, Disp: 90 tablet, Rfl: 1 .  eszopiclone (LUNESTA) 2 MG TABS tablet, Take 1 tablet (2 mg total) by mouth at bedtime as needed for sleep. Take immediately before bedtime, Disp: 30 tablet, Rfl: 1 .  gabapentin (NEURONTIN) 100 MG capsule, TAKE 2 CAPSULES  (200 MG TOTAL) BY MOUTH AT BEDTIME., Disp: 180 capsule, Rfl: 1 .  hydrocortisone (ANUSOL-HC) 25 MG suppository, Place 1 suppository (25 mg total) rectally 2 (two) times daily., Disp: 12 suppository, Rfl: 0 .  losartan-hydrochlorothiazide (HYZAAR) 100-25 MG tablet, Take 1 tablet by mouth daily., Disp: 90 tablet, Rfl: 0 .  venlafaxine (EFFEXOR) 75 MG tablet, TAKE 1 TABLET BY MOUTH EVERY DAY, Disp: 90 tablet, Rfl: 1  Allergies  Allergen Reactions  . Lisinopril     cough     ROS See HPI  Objective  Vitals:   02/09/18 1400  BP: (!) 146/68  Pulse: 92  Resp: 16  Temp: 98.5 F (36.9 C)  TempSrc: Oral  SpO2: 97%  Weight: 141 lb (64 kg)  Height: 5\' 6"  (1.676 m)    Body mass index is 22.76 kg/m.  Physical Exam Vital signs reviewed. Constitutional: Patient appears well-developed and well-nourished. No distress.  HENT: Head: Normocephalic and atraumatic.  Nose: Nose normal. Mouth/Throat: Oropharynx is clear and moist. No oropharyngeal exudate.  Eyes: Conjunctivae and EOM are normal. Pupils are equal, round, and reactive to light. No scleral icterus.  Neck: Normal range of motion. Neck supple.  Cardiovascular: Normal rate, regular rhythm and normal heart sounds. No BLE edema. Distal pulses intact. Pulmonary/Chest: Effort normal and breath sounds normal. No respiratory distress. Neurological: She is alert and oriented to person, place, and time. No cranial nerve deficit. Coordination, balance, strength, speech and gait are normal.  Skin: Skin is warm and dry. No rash noted. No erythema.  Psychiatric: Patient has a normal mood and affect. behavior is normal. Judgment and thought content normal.   Assessment & Plan RTC in 3 months for F/U: HTN- recheck BP reading, CPE with annual lab work if stable  Cough Resolving with discontinuation of lisinopril F/U for new symptoms, worsening or persistent cough

## 2018-02-09 NOTE — Assessment & Plan Note (Addendum)
BP appears stable on losartan-HCTZ, although systolic reading is slightly above goal Continue current medication dosage for now Additional information provided in AVS RTC in 3 months for F/U- consider medication adjustment if BP remains elevated at next OV - Basic metabolic panel; Future

## 2018-02-22 ENCOUNTER — Other Ambulatory Visit: Payer: Self-pay | Admitting: Nurse Practitioner

## 2018-02-22 DIAGNOSIS — F341 Dysthymic disorder: Secondary | ICD-10-CM

## 2018-03-16 ENCOUNTER — Other Ambulatory Visit: Payer: Self-pay | Admitting: Nurse Practitioner

## 2018-03-16 DIAGNOSIS — I10 Essential (primary) hypertension: Secondary | ICD-10-CM

## 2018-03-18 ENCOUNTER — Ambulatory Visit: Payer: BLUE CROSS/BLUE SHIELD | Admitting: Internal Medicine

## 2018-04-18 ENCOUNTER — Other Ambulatory Visit: Payer: Self-pay | Admitting: Nurse Practitioner

## 2018-04-25 ENCOUNTER — Other Ambulatory Visit: Payer: Self-pay | Admitting: Nurse Practitioner

## 2018-05-12 ENCOUNTER — Ambulatory Visit (INDEPENDENT_AMBULATORY_CARE_PROVIDER_SITE_OTHER): Payer: BLUE CROSS/BLUE SHIELD | Admitting: Nurse Practitioner

## 2018-05-12 ENCOUNTER — Other Ambulatory Visit (INDEPENDENT_AMBULATORY_CARE_PROVIDER_SITE_OTHER): Payer: BLUE CROSS/BLUE SHIELD

## 2018-05-12 ENCOUNTER — Encounter: Payer: Self-pay | Admitting: Nurse Practitioner

## 2018-05-12 VITALS — BP 112/68 | HR 92 | Ht 66.0 in | Wt 139.0 lb

## 2018-05-12 DIAGNOSIS — Z13220 Encounter for screening for lipoid disorders: Secondary | ICD-10-CM | POA: Diagnosis not present

## 2018-05-12 DIAGNOSIS — Z9189 Other specified personal risk factors, not elsewhere classified: Secondary | ICD-10-CM

## 2018-05-12 DIAGNOSIS — Z114 Encounter for screening for human immunodeficiency virus [HIV]: Secondary | ICD-10-CM

## 2018-05-12 DIAGNOSIS — Z1159 Encounter for screening for other viral diseases: Secondary | ICD-10-CM

## 2018-05-12 DIAGNOSIS — Z0000 Encounter for general adult medical examination without abnormal findings: Secondary | ICD-10-CM

## 2018-05-12 DIAGNOSIS — I10 Essential (primary) hypertension: Secondary | ICD-10-CM

## 2018-05-12 DIAGNOSIS — Z1239 Encounter for other screening for malignant neoplasm of breast: Secondary | ICD-10-CM

## 2018-05-12 DIAGNOSIS — F341 Dysthymic disorder: Secondary | ICD-10-CM

## 2018-05-12 LAB — COMPREHENSIVE METABOLIC PANEL
ALT: 24 U/L (ref 0–35)
AST: 18 U/L (ref 0–37)
Albumin: 5 g/dL (ref 3.5–5.2)
Alkaline Phosphatase: 57 U/L (ref 39–117)
BUN: 21 mg/dL (ref 6–23)
CO2: 28 mEq/L (ref 19–32)
Calcium: 10.1 mg/dL (ref 8.4–10.5)
Chloride: 101 mEq/L (ref 96–112)
Creatinine, Ser: 1.06 mg/dL (ref 0.40–1.20)
GFR: 68.7 mL/min (ref >60.00)
Glucose, Bld: 150 mg/dL — ABNORMAL HIGH (ref 70–99)
Potassium: 3.5 mEq/L (ref 3.5–5.1)
Sodium: 138 mEq/L (ref 135–145)
Total Bilirubin: 0.4 mg/dL (ref 0.2–1.2)
Total Protein: 8.1 g/dL (ref 6.0–8.3)

## 2018-05-12 LAB — CBC
HCT: 34.9 % — ABNORMAL LOW (ref 36.0–46.0)
Hemoglobin: 11.9 g/dL — ABNORMAL LOW (ref 12.0–15.0)
MCHC: 34.2 g/dL (ref 30.0–36.0)
MCV: 90.9 fl (ref 78.0–100.0)
Platelets: 248 10*3/uL (ref 150.0–400.0)
RBC: 3.84 Mil/uL — ABNORMAL LOW (ref 3.87–5.11)
RDW: 13.4 % (ref 11.5–15.5)
WBC: 5.5 10*3/uL (ref 4.0–10.5)

## 2018-05-12 LAB — LDL CHOLESTEROL, DIRECT: Direct LDL: 55 mg/dL

## 2018-05-12 LAB — LIPID PANEL
Cholesterol: 200 mg/dL (ref 0–200)
HDL: 34.6 mg/dL — ABNORMAL LOW (ref >39.00)
Total CHOL/HDL Ratio: 6
Triglycerides: 687 mg/dL — ABNORMAL HIGH (ref 0.0–149.0)

## 2018-05-12 NOTE — Assessment & Plan Note (Signed)
Stable Continue current medication Continue to monitor - CBC; Future - Comprehensive metabolic panel; Future - Lipid panel; Future

## 2018-05-12 NOTE — Assessment & Plan Note (Signed)
Reviewed/recommended annual screening exams and health maintenance, healthy lifestyle,  additional information provided on AVS - CBC; Future - Comprehensive metabolic panel; Future - Lipid panel; Future - MM DIGITAL SCREENING BILATERAL; Future - HIV Antibody (routine testing w rflx); Future - Hepatitis C antibody; Future  Screening for HIV (human immunodeficiency virus) - HIV Antibody (routine testing w rflx); Future  Encounter for hepatitis C virus screening test for high risk patient - Hepatitis C antibody; Future  Screening for cholesterol level She is fasting - Lipid panel; Future  Screening for breast cancer - MM DIGITAL SCREENING BILATERAL; Future

## 2018-05-12 NOTE — Patient Instructions (Signed)
Head downstairs for lab work  It was nice to see you today!  Health Maintenance, Female Adopting a healthy lifestyle and getting preventive care can go a long way to promote health and wellness. Talk with your health care provider about what schedule of regular examinations is right for you. This is a good chance for you to check in with your provider about disease prevention and staying healthy. In between checkups, there are plenty of things you can do on your own. Experts have done a lot of research about which lifestyle changes and preventive measures are most likely to keep you healthy. Ask your health care provider for more information. Weight and diet Eat a healthy diet  Be sure to include plenty of vegetables, fruits, low-fat dairy products, and lean protein.  Do not eat a lot of foods high in solid fats, added sugars, or salt.  Get regular exercise. This is one of the most important things you can do for your health. ? Most adults should exercise for at least 150 minutes each week. The exercise should increase your heart rate and make you sweat (moderate-intensity exercise). ? Most adults should also do strengthening exercises at least twice a week. This is in addition to the moderate-intensity exercise.  Maintain a healthy weight  Body mass index (BMI) is a measurement that can be used to identify possible weight problems. It estimates body fat based on height and weight. Your health care provider can help determine your BMI and help you achieve or maintain a healthy weight.  For females 45 years of age and older: ? A BMI below 18.5 is considered underweight. ? A BMI of 18.5 to 24.9 is normal. ? A BMI of 25 to 29.9 is considered overweight. ? A BMI of 30 and above is considered obese.  Watch levels of cholesterol and blood lipids  You should start having your blood tested for lipids and cholesterol at 57 years of age, then have this test every 5 years.  You may need to have  your cholesterol levels checked more often if: ? Your lipid or cholesterol levels are high. ? You are older than 57 years of age. ? You are at high risk for heart disease.  Cancer screening Lung Cancer  Lung cancer screening is recommended for adults 33-65 years old who are at high risk for lung cancer because of a history of smoking.  A yearly low-dose CT scan of the lungs is recommended for people who: ? Currently smoke. ? Have quit within the past 15 years. ? Have at least a 30-pack-year history of smoking. A pack year is smoking an average of one pack of cigarettes a day for 1 year.  Yearly screening should continue until it has been 15 years since you quit.  Yearly screening should stop if you develop a health problem that would prevent you from having lung cancer treatment.  Breast Cancer  Practice breast self-awareness. This means understanding how your breasts normally appear and feel.  It also means doing regular breast self-exams. Let your health care provider know about any changes, no matter how small.  If you are in your 20s or 30s, you should have a clinical breast exam (CBE) by a health care provider every 1-3 years as part of a regular health exam.  If you are 35 or older, have a CBE every year. Also consider having a breast X-ray (mammogram) every year.  If you have a family history of breast cancer, talk to  your health care provider about genetic screening.  If you are at high risk for breast cancer, talk to your health care provider about having an MRI and a mammogram every year.  Breast cancer gene (BRCA) assessment is recommended for women who have family members with BRCA-related cancers. BRCA-related cancers include: ? Breast. ? Ovarian. ? Tubal. ? Peritoneal cancers.  Results of the assessment will determine the need for genetic counseling and BRCA1 and BRCA2 testing.  Cervical Cancer Your health care provider may recommend that you be screened  regularly for cancer of the pelvic organs (ovaries, uterus, and vagina). This screening involves a pelvic examination, including checking for microscopic changes to the surface of your cervix (Pap test). You may be encouraged to have this screening done every 3 years, beginning at age 47.  For women ages 48-65, health care providers may recommend pelvic exams and Pap testing every 3 years, or they may recommend the Pap and pelvic exam, combined with testing for human papilloma virus (HPV), every 5 years. Some types of HPV increase your risk of cervical cancer. Testing for HPV may also be done on women of any age with unclear Pap test results.  Other health care providers may not recommend any screening for nonpregnant women who are considered low risk for pelvic cancer and who do not have symptoms. Ask your health care provider if a screening pelvic exam is right for you.  If you have had past treatment for cervical cancer or a condition that could lead to cancer, you need Pap tests and screening for cancer for at least 20 years after your treatment. If Pap tests have been discontinued, your risk factors (such as having a new sexual partner) need to be reassessed to determine if screening should resume. Some women have medical problems that increase the chance of getting cervical cancer. In these cases, your health care provider may recommend more frequent screening and Pap tests.  Colorectal Cancer  This type of cancer can be detected and often prevented.  Routine colorectal cancer screening usually begins at 57 years of age and continues through 57 years of age.  Your health care provider may recommend screening at an earlier age if you have risk factors for colon cancer.  Your health care provider may also recommend using home test kits to check for hidden blood in the stool.  A small camera at the end of a tube can be used to examine your colon directly (sigmoidoscopy or colonoscopy). This is  done to check for the earliest forms of colorectal cancer.  Routine screening usually begins at age 20.  Direct examination of the colon should be repeated every 5-10 years through 57 years of age. However, you may need to be screened more often if early forms of precancerous polyps or small growths are found.  Skin Cancer  Check your skin from head to toe regularly.  Tell your health care provider about any new moles or changes in moles, especially if there is a change in a mole's shape or color.  Also tell your health care provider if you have a mole that is larger than the size of a pencil eraser.  Always use sunscreen. Apply sunscreen liberally and repeatedly throughout the day.  Protect yourself by wearing long sleeves, pants, a wide-brimmed hat, and sunglasses whenever you are outside.  Heart disease, diabetes, and high blood pressure  High blood pressure causes heart disease and increases the risk of stroke. High blood pressure is more likely  to develop in: ? People who have blood pressure in the high end of the normal range (130-139/85-89 mm Hg). ? People who are overweight or obese. ? People who are African American.  If you are 41-24 years of age, have your blood pressure checked every 3-5 years. If you are 31 years of age or older, have your blood pressure checked every year. You should have your blood pressure measured twice-once when you are at a hospital or clinic, and once when you are not at a hospital or clinic. Record the average of the two measurements. To check your blood pressure when you are not at a hospital or clinic, you can use: ? An automated blood pressure machine at a pharmacy. ? A home blood pressure monitor.  If you are between 45 years and 72 years old, ask your health care provider if you should take aspirin to prevent strokes.  Have regular diabetes screenings. This involves taking a blood sample to check your fasting blood sugar level. ? If you are  at a normal weight and have a low risk for diabetes, have this test once every three years after 57 years of age. ? If you are overweight and have a high risk for diabetes, consider being tested at a younger age or more often. Preventing infection Hepatitis B  If you have a higher risk for hepatitis B, you should be screened for this virus. You are considered at high risk for hepatitis B if: ? You were born in a country where hepatitis B is common. Ask your health care provider which countries are considered high risk. ? Your parents were born in a high-risk country, and you have not been immunized against hepatitis B (hepatitis B vaccine). ? You have HIV or AIDS. ? You use needles to inject street drugs. ? You live with someone who has hepatitis B. ? You have had sex with someone who has hepatitis B. ? You get hemodialysis treatment. ? You take certain medicines for conditions, including cancer, organ transplantation, and autoimmune conditions.  Hepatitis C  Blood testing is recommended for: ? Everyone born from [birth year] through 1965. ? Anyone with known risk factors for hepatitis C.  Sexually transmitted infections (STIs)  You should be screened for sexually transmitted infections (STIs) including gonorrhea and chlamydia if: ? You are sexually active and are younger than 57 years of age. ? You are older than 57 years of age and your health care provider tells you that you are at risk for this type of infection. ? Your sexual activity has changed since you were last screened and you are at an increased risk for chlamydia or gonorrhea. Ask your health care provider if you are at risk.  If you do not have HIV, but are at risk, it may be recommended that you take a prescription medicine daily to prevent HIV infection. This is called pre-exposure prophylaxis (PrEP). You are considered at risk if: ? You are sexually active and do not regularly use condoms or know the HIV status of your  partner(s). ? You take drugs by injection. ? You are sexually active with a partner who has HIV.  Talk with your health care provider about whether you are at high risk of being infected with HIV. If you choose to begin PrEP, you should first be tested for HIV. You should then be tested every 3 months for as long as you are taking PrEP. Pregnancy  If you are premenopausal and you may become  pregnant, ask your health care provider about preconception counseling.  If you may become pregnant, take 400 to 800 micrograms (mcg) of folic acid every day.  If you want to prevent pregnancy, talk to your health care provider about birth control (contraception). Osteoporosis and menopause  Osteoporosis is a disease in which the bones lose minerals and strength with aging. This can result in serious bone fractures. Your risk for osteoporosis can be identified using a bone density scan.  If you are 72 years of age or older, or if you are at risk for osteoporosis and fractures, ask your health care provider if you should be screened.  Ask your health care provider whether you should take a calcium or vitamin D supplement to lower your risk for osteoporosis.  Menopause may have certain physical symptoms and risks.  Hormone replacement therapy may reduce some of these symptoms and risks. Talk to your health care provider about whether hormone replacement therapy is right for you. Follow these instructions at home:  Schedule regular health, dental, and eye exams.  Stay current with your immunizations.  Do not use any tobacco products including cigarettes, chewing tobacco, or electronic cigarettes.  If you are pregnant, do not drink alcohol.  If you are breastfeeding, limit how much and how often you drink alcohol.  Limit alcohol intake to no more than 1 drink per day for nonpregnant women. One drink equals 12 ounces of beer, 5 ounces of wine, or 1 ounces of hard liquor.  Do not use street  drugs.  Do not share needles.  Ask your health care provider for help if you need support or information about quitting drugs.  Tell your health care provider if you often feel depressed.  Tell your health care provider if you have ever been abused or do not feel safe at home. This information is not intended to replace advice given to you by your health care provider. Make sure you discuss any questions you have with your health care provider. Document Released: 01/05/2011 Document Revised: 11/28/2015 Document Reviewed: 03/26/2015 Elsevier Interactive Patient Education  [employer].

## 2018-05-12 NOTE — Assessment & Plan Note (Signed)
Stable Continue effexor Continue to monitor

## 2018-05-12 NOTE — Progress Notes (Signed)
Jessica Lara is a 57 y.o. female with the following history as recorded in [employer]:  Patient Active Problem List   Diagnosis Date Noted  . Insomnia 09/26/2017  . Cervical radiculopathy at C7 07/13/2017  . Sleep disturbance 08/06/2015  . Essential hypertension 08/06/2015  . Routine health maintenance 08/24/2013  . Laceration of neck 01/09/2012  . Duodenitis without mention of hemorrhage 01/09/2012  . Hyperglycemia 05/15/2011  . ANXIETY DEPRESSION 07/08/2010  . Other and unspecified hyperlipidemia 06/16/2010  . SMOKER 06/16/2010    Current Outpatient Medications  Medication Sig Dispense Refill  . Diclofenac Sodium 2 % SOLN Place 2 g onto the skin 2 (two) times daily. 112 g 3  . DUEXIS 800-26.6 MG TABS TAKE 1 TABLET 3 TIMES A DAY AS NEEDED 90 tablet 1  . eszopiclone (LUNESTA) 2 MG TABS tablet Take 1 tablet (2 mg total) by mouth at bedtime as needed for sleep. Take immediately before bedtime 30 tablet 1  . gabapentin (NEURONTIN) 100 MG capsule TAKE 2 CAPSULES (200 MG TOTAL) BY MOUTH AT BEDTIME. 180 capsule 1  . hydrocortisone (ANUSOL-HC) 25 MG suppository Place 1 suppository (25 mg total) rectally 2 (two) times daily. 12 suppository 0  . olmesartan-hydrochlorothiazide (BENICAR HCT) 40-25 MG tablet Take 1 tablet by mouth daily. 90 tablet 0  . venlafaxine (EFFEXOR) 75 MG tablet TAKE 1 TABLET BY MOUTH EVERY DAY 90 tablet 0   No current facility-administered medications for this visit.     Allergies: Lisinopril  Past Medical History:  Diagnosis Date  . ANXIETY DEPRESSION   . Chicken pox   . H/O: hysterectomy   . Hypertension   . Other and unspecified hyperlipidemia   . SMOKER   . Unspecified essential hypertension     Past Surgical History:  Procedure Laterality Date  . ABDOMINAL HYSTERECTOMY  1999   fibroids  . ESOPHAGOGASTRODUODENOSCOPY  01/09/2012   Procedure: ESOPHAGOGASTRODUODENOSCOPY (EGD);  Surgeon: Irene Shipper, MD;  Location: [facility] ENDOSCOPY;  Service: Endoscopy;   Laterality: N/A;  . single oopherectomy  1999    Family History  Problem Relation Age of Onset  . Diabetes Mother   . Hypertension Mother   . Hyperlipidemia Mother   . Stomach cancer Father   . Colon cancer Neg Hx   . Esophageal cancer Neg Hx   . Rectal cancer Neg Hx     Social History   Tobacco Use  . Smoking status: Current Every Day Smoker    Packs/day: 1.00    Years: 33.00    Pack years: 33.00    Types: Cigarettes  . Smokeless tobacco: Never Used  Substance Use Topics  . Alcohol use: Yes    Alcohol/week: 2.0 standard drinks    Types: 2 Cans of beer per week     Subjective:  Ms Favor is here today for blood pressure follow up, cpe.  CPE-  Last dental exam: no routine dental exam Last vision exam: no regular eye exam PAP:  Total hysterectomy  Mammogram- ordered Colonoscopy: up to date Lipids: lipid panel ordered today A1c: N/A Vaccinations: declines flu vacc  Hypertension -maintained on olmesartan-HCT 40--25 daily Reports daily medication compliance without adverse medication effects. Reports she does not check BP readings at home   BP Readings from Last 3 Encounters:  05/12/18 112/68  02/09/18 (!) 146/68  01/18/18 140/72    Review of Systems  Constitutional: Negative for chills and fever.  HENT: Negative for hearing loss and sore throat.   Eyes: Negative for blurred vision  and double vision.  Respiratory: Negative for cough and shortness of breath.   Cardiovascular: Negative for chest pain, palpitations and leg swelling.  Gastrointestinal: Negative for abdominal pain, nausea and vomiting.  Genitourinary: Negative for dysuria and hematuria.  Musculoskeletal: Negative for falls.  Skin: Negative for rash.  Neurological: Negative for speech change, loss of consciousness, weakness and headaches.  Endo/Heme/Allergies: Does not bruise/bleed easily.  Psychiatric/Behavioral: Negative for depression. The patient is not nervous/anxious.     Objective:   Vitals:   05/12/18 1005  BP: 112/68  Pulse: 92  SpO2: 95%  Weight: 139 lb (63 kg)  Height: 5\' 6"  (1.676 m)    General: Well developed, well nourished, in no acute distress  Skin : Warm and dry.  Head: Normocephalic and atraumatic  Eyes: Sclera and conjunctiva clear; pupils round and reactive to light; extraocular movements intact  Ears: External normal; canals clear; tympanic membranes normal  Oropharynx: Pink, supple. No suspicious lesions  Neck: Supple without thyromegaly, adenopathy  Lungs: Respirations unlabored; clear to auscultation bilaterally without wheeze, rales, rhonchi  CVS exam: normal rate and regular rhythm, S1 and S2 normal.  Abdomen: Soft; rotund; nontender;  normoactive bowel sounds; no masses or hepatosplenomegaly  Musculoskeletal: No deformities; no active joint inflammation  Extremities: No edema, cyanosis Vessels: Symmetric bilaterally  Neurologic: Alert and oriented; speech intact; face symmetrical; moves all extremities well; CNII-XII intact without focal deficit  Psychiatric: Normal mood and affect.   Assessment:  1. Routine general medical examination at a health care facility   2. Essential hypertension   3. Screening for HIV (human immunodeficiency virus)   4. Encounter for hepatitis C virus screening test for high risk patient   5. Screening for cholesterol level   6. Screening for breast cancer   7. ANXIETY DEPRESSION     Plan:   Return in about 6 months (around 11/10/2018) for routine f/u: htn- recheck BP.  Orders Placed This Encounter  Procedures  . MM DIGITAL SCREENING BILATERAL    Standing Status:   Future    Standing Expiration Date:   07/13/2019    Order Specific Question:   Reason for Exam (SYMPTOM  OR DIAGNOSIS REQUIRED)    Answer:   screening for breast cancer    Order Specific Question:   Is the patient pregnant?    Answer:   No    Order Specific Question:   Preferred imaging location?    Answer:   [facility]  . CBC     Standing Status:   Future    Standing Expiration Date:   05/13/2019  . Comprehensive metabolic panel    Standing Status:   Future    Standing Expiration Date:   05/13/2019  . Lipid panel    Standing Status:   Future    Standing Expiration Date:   05/13/2019  . HIV Antibody (routine testing w rflx)    Standing Status:   Future    Standing Expiration Date:   06/11/2018  . Hepatitis C antibody    Standing Status:   Future    Standing Expiration Date:   06/11/2018    Requested Prescriptions    No prescriptions requested or ordered in this encounter

## 2018-05-13 LAB — HIV ANTIBODY (ROUTINE TESTING W REFLEX): HIV 1&2 Ab, 4th Generation: NONREACTIVE

## 2018-05-13 LAB — HEPATITIS C ANTIBODY
Hepatitis C Ab: NONREACTIVE
SIGNAL TO CUT-OFF: 0.01 (ref <1.00)

## 2018-05-16 ENCOUNTER — Other Ambulatory Visit: Payer: Self-pay | Admitting: Nurse Practitioner

## 2018-05-16 ENCOUNTER — Other Ambulatory Visit: Payer: Self-pay | Admitting: Family

## 2018-05-16 DIAGNOSIS — E781 Pure hyperglyceridemia: Secondary | ICD-10-CM

## 2018-05-16 DIAGNOSIS — R7309 Other abnormal glucose: Secondary | ICD-10-CM

## 2018-05-16 DIAGNOSIS — F341 Dysthymic disorder: Secondary | ICD-10-CM

## 2018-05-17 ENCOUNTER — Encounter: Payer: Self-pay | Admitting: *Deleted

## 2018-05-17 ENCOUNTER — Other Ambulatory Visit (INDEPENDENT_AMBULATORY_CARE_PROVIDER_SITE_OTHER): Payer: BLUE CROSS/BLUE SHIELD

## 2018-05-17 DIAGNOSIS — R739 Hyperglycemia, unspecified: Secondary | ICD-10-CM | POA: Diagnosis not present

## 2018-05-17 LAB — HEMOGLOBIN A1C: Hgb A1c MFr Bld: 6.2 % (ref 4.6–6.5)

## 2018-06-21 ENCOUNTER — Encounter: Payer: Self-pay | Admitting: Internal Medicine

## 2018-06-21 ENCOUNTER — Ambulatory Visit (INDEPENDENT_AMBULATORY_CARE_PROVIDER_SITE_OTHER): Payer: BLUE CROSS/BLUE SHIELD | Admitting: Internal Medicine

## 2018-06-21 VITALS — BP 146/70 | HR 80 | Ht 66.0 in | Wt 144.0 lb

## 2018-06-21 DIAGNOSIS — T887XXA Unspecified adverse effect of drug or medicament, initial encounter: Secondary | ICD-10-CM

## 2018-06-21 DIAGNOSIS — E781 Pure hyperglyceridemia: Secondary | ICD-10-CM

## 2018-06-21 MED ORDER — ICOSAPENT ETHYL 1 G PO CAPS: 2.0000 g | ORAL_CAPSULE | Freq: Two times a day (BID) | ORAL | 6 refills | Status: DC

## 2018-06-21 NOTE — Patient Instructions (Signed)
Medication Instructions:  START vascepa 2 gram (2 capsules) by mouth twice daily  If you need a refill on your cardiac medications before your next appointment, please call your pharmacy.   Lab work: FASTING lab work in 3 months to re-check cholesterol If you have labs (blood work) drawn today and your tests are completely normal, you will receive your results only by: Marland Kitchen MyChart Message (if you have MyChart) OR . A paper copy in the mail If you have any lab test that is abnormal or we need to change your treatment, we will call you to review the results.   Follow-Up: At [facility], you and your health needs are our priority.  As part of our continuing mission to provide you with exceptional heart care, we have created designated Provider Care Teams.  These Care Teams include your primary Cardiologist (physician) and Advanced Practice Providers (APPs -  Physician Assistants and Nurse Practitioners) who all work together to provide you with the care you need, when you need it. . You will need a follow up appointment in 3 months with Jessica Lara ([place])  Any Other Special Instructions Will Be Listed Below (If Applicable).

## 2018-06-21 NOTE — Progress Notes (Signed)
LIPID CLINIC CONSULT NOTE  Chief Complaint:  High triglycerides  Primary Care Physician: Lance Sell, NP  HPI:  Jessica Lara is a 57 y.o. female who is being seen today for the evaluation of high triglycerides at the request of Marrian Salvage,*.  This is a pleasant 57 year old female who is kindly referred for evaluation of high triglycerides.  She has a history of this with triglycerides over 300 about 4 years ago.  Recently a lipid profile showed total cholesterol 200, triglycerides 687 and HDL 34.  We discussed her diet which sounds like there is moderate amount of intake of saturated fats although generally she eats pretty healthy.  She is a smoker.  She has no known history of heart disease.  She does have hypertension.  There is no family history of lipid disorders or early onset heart disease.  Interestingly she does take venlafaxine.  There are number of case reports indicating that venlafaxine can raise triglycerides.  She says she has been on the medicine probably for a year or year and a half, so does not explain the elevated triglycerides 4 years ago, but certainly may be causing acute on chronic elevation in her triglycerides.  She wants to try to wean herself off of the Effexor though and did not tolerate it well.  I suspect the weaning.  Was too short.  She is asymptomatic from a cardiac standpoint, denies any chest pain or worsening shortness of breath.  PMHx:  Past Medical History:  Diagnosis Date  . ANXIETY DEPRESSION   . Chicken pox   . H/O: hysterectomy   . Hypertension   . Other and unspecified hyperlipidemia   . SMOKER   . Unspecified essential hypertension     Past Surgical History:  Procedure Laterality Date  . ABDOMINAL HYSTERECTOMY  1999   fibroids  . ESOPHAGOGASTRODUODENOSCOPY  01/09/2012   Procedure: ESOPHAGOGASTRODUODENOSCOPY (EGD);  Surgeon: Irene Shipper, MD;  Location: [facility] ENDOSCOPY;  Service: Endoscopy;  Laterality: N/A;  . single  oopherectomy  1999    FAMHx:  Family History  Problem Relation Age of Onset  . Diabetes Mother   . Hypertension Mother   . Hyperlipidemia Mother   . Stomach cancer Father   . Heart disease Sister   . Hypertension Brother   . Diabetes Brother   . Colon cancer Neg Hx   . Esophageal cancer Neg Hx   . Rectal cancer Neg Hx     SOCHx:   reports that she has been smoking cigarettes. She has a 33.00 pack-year smoking history. She has never used smokeless tobacco. She reports current alcohol use of about 2.0 standard drinks of alcohol per week. She reports that she does not use drugs.  ALLERGIES:  Allergies  Allergen Reactions  . Lisinopril     cough    ROS: Pertinent items noted in HPI and remainder of comprehensive ROS otherwise negative.  HOME MEDS: Current Outpatient Medications on File Prior to Visit  Medication Sig Dispense Refill  . eszopiclone (LUNESTA) 2 MG TABS tablet Take 1 tablet (2 mg total) by mouth at bedtime as needed for sleep. Take immediately before bedtime 30 tablet 1  . gabapentin (NEURONTIN) 100 MG capsule TAKE 2 CAPSULES (200 MG TOTAL) BY MOUTH AT BEDTIME. 180 capsule 1  . olmesartan-hydrochlorothiazide (BENICAR HCT) 40-25 MG tablet Take 1 tablet by mouth daily. 90 tablet 0  . venlafaxine (EFFEXOR) 75 MG tablet TAKE 1 TABLET BY MOUTH EVERY DAY 90 tablet 1  No current facility-administered medications on file prior to visit.     LABS/IMAGING: No results found for this or any previous visit (from the past 48 hour(s)). No results found.  LIPID PANEL:    Component Value Date/Time   CHOL 200 05/12/2018 1039   TRIG (H) 05/12/2018 1039    687.0 Triglyceride is over 400; calculations on Lipids are invalid.   HDL 34.60 (L) 05/12/2018 1039   CHOLHDL 6 05/12/2018 1039   VLDL 71.8 (H) 08/24/2013 1222   LDLDIRECT 55.0 05/12/2018 1039    WEIGHTS: Wt Readings from Last 3 Encounters:  06/21/18 144 lb (65.3 kg)  05/12/18 139 lb (63 kg)  02/09/18 141 lb (64  kg)    VITALS: BP (!) 146/70   Pulse 80   Ht 5\' 6"  (1.676 m)   Wt 144 lb (65.3 kg)   BMI 23.24 kg/m   EXAM: General appearance: alert and no distress Neck: no carotid bruit, no JVD and thyroid not enlarged, symmetric, no tenderness/mass/nodules Lungs: clear to auscultation bilaterally Heart: regular rate and rhythm, S1, S2 normal, no murmur, click, rub or gallop Abdomen: soft, non-tender; bowel sounds normal; no masses,  no organomegaly Extremities: extremities normal, atraumatic, no cyanosis or edema Pulses: 2+ and symmetric Skin: Skin color, texture, turgor normal. No rashes or lesions Neurologic: Grossly normal : Pleasant  EKG: Deferred  ASSESSMENT: 1. Friedrickson type V hypertriglyceridemia 2. Possible medication side-effect  PLAN: 1.   Jessica Lara has type V hypertriglyceridemia with a history of elevated triglycerides in the past however recently a marked increase in this.  Her diet is been fairly consistent.  Some of this may be dietary however I suspect the venlafaxine may additionally caused an elevation in her triglycerides.  If possible weaning this off to another agent would be beneficial.  For now, since she is at increased risk of pancreatitis, and recommend starting Vascepa 2 g p.o. twice daily.  We gave her samples of this and a co-pay assistance card.  We will plan to check a lipid profile and direct LDL in about 3 months.  Thanks again for the kind referral.  Pixie Casino, MD, FACC, [place] Director of the [facility]  [facility] Diplomate of the American Board of Clinical Lipidology Attending Cardiologist  Direct Dial: [phone]  Fax: [phone]  Website:  [email or web address] 06/21/2018, 9:07 AM

## 2018-07-21 ENCOUNTER — Other Ambulatory Visit: Payer: Self-pay | Admitting: Nurse Practitioner

## 2018-08-10 ENCOUNTER — Telehealth: Payer: Self-pay | Admitting: Internal Medicine

## 2018-08-10 NOTE — Telephone Encounter (Signed)
Called, notified patient of message from PharmD.  Patient verbalized understanding, and would try that.

## 2018-08-10 NOTE — Telephone Encounter (Signed)
New message     Pt c/o medication issue:  1. Name of Medication: vascepa 1 gm  2. How are you currently taking this medication (dosage and times per day)?   3. Are you having a reaction (difficulty breathing--STAT)?   4. What is your medication issue? Pt stated that cost of medication was to high and was advised to call md for discount card

## 2018-08-10 NOTE — Telephone Encounter (Signed)
[email or web address] will allow him to download copay card Patient may call at [phone].

## 2018-08-10 NOTE — Telephone Encounter (Signed)
Called patient, she states that she has had a copay card before, but with this recent refill it would not take and the medication was going to cost $800. I went to see if any recent copay cards were in the sample room and they are expired.  Any recommendations on how to get it cheaper for patient?

## 2018-09-22 ENCOUNTER — Telehealth: Payer: Self-pay | Admitting: *Deleted

## 2018-09-22 NOTE — Telephone Encounter (Signed)
Left message for patient, will send to dr hilty to review and advise. Follow up rescheduled.

## 2018-09-22 NOTE — Telephone Encounter (Signed)
New Message    Pt c/o medication issue:  1. Name of Medication: Icosapent Ethyl  2. How are you currently taking this medication (dosage and times per day)? 2g twice a day  3. Are you having a reaction (difficulty breathing--STAT)? No  4. What is your medication issue? Patient states she's out of the medication and it's too expensive for her.  She presented the coupon to [facility] on [address] and they refused her coupon.  She would like a nurse to call her back.

## 2018-09-22 NOTE — Telephone Encounter (Signed)
Patient return called  Patient has already been reschedule for lipid clinic Patient states she only was apply to take Vascepa for one month - due to the cost of medication over $400  .she states tried to use  Coupon  Again it was denied. Will attempt to call pharmacy

## 2018-09-22 NOTE — Telephone Encounter (Signed)
LEFT MESSAGE FOR PATIENT TO CALL BACK - NEED TO POSTPONE UPCOMING LIPID CLINIC APPT - DR HILTY ON 09/27/18

## 2018-09-22 NOTE — Telephone Encounter (Signed)
I don't know why they would refuse the coupon - if the medication is too expensive for now, we'll reinvestigate it later at her rescheduled appt.  Dr. Lemmie Evens

## 2018-09-27 ENCOUNTER — Ambulatory Visit: Payer: BLUE CROSS/BLUE SHIELD | Admitting: Internal Medicine

## 2018-10-31 ENCOUNTER — Telehealth: Payer: Self-pay | Admitting: Nurse Practitioner

## 2018-10-31 NOTE — Telephone Encounter (Signed)
Patient has called stating she had a tooth broke off and has caused infection.  Patient would like a visit to get an antibiotic prescribed. Patient states her dentis told her it would be months before she could get in.  I did advise patient to call dentist back for advise.  She has also stated she would try to reach out to another dentist as well.

## 2018-11-04 ENCOUNTER — Telehealth: Payer: Self-pay

## 2018-11-04 NOTE — Telephone Encounter (Signed)
Left message on voicemail for patient to call office to change office visit to virtual visit due to covid 19 on May 4.

## 2018-11-07 ENCOUNTER — Other Ambulatory Visit: Payer: Self-pay | Admitting: *Deleted

## 2018-11-07 ENCOUNTER — Ambulatory Visit: Payer: BLUE CROSS/BLUE SHIELD | Admitting: Internal Medicine

## 2018-11-07 DIAGNOSIS — F341 Dysthymic disorder: Secondary | ICD-10-CM

## 2018-11-07 MED ORDER — VENLAFAXINE HCL 75 MG PO TABS: 75.0000 mg | ORAL_TABLET | Freq: Every day | ORAL | 0 refills | Status: DC

## 2018-11-07 NOTE — Telephone Encounter (Signed)
LM for patient to call back to discuss visit today 11/07/18 - change to virtual or r/s? She was due for lipid panel testing prior to this scheduled visit.

## 2018-12-08 ENCOUNTER — Encounter: Payer: Self-pay | Admitting: Internal Medicine

## 2018-12-23 ENCOUNTER — Ambulatory Visit: Payer: BC Managed Care – PPO | Admitting: *Deleted

## 2018-12-23 ENCOUNTER — Other Ambulatory Visit: Payer: Self-pay

## 2018-12-23 VITALS — Ht 66.0 in | Wt 137.0 lb

## 2018-12-23 DIAGNOSIS — Z86010 Personal history of colonic polyps: Secondary | ICD-10-CM

## 2018-12-23 NOTE — Progress Notes (Signed)
No egg or soy allergy known to patient  No issues with past sedation with any surgeries  or procedures, no intubation problems  No diet pills per patient No home 02 use per patient  No blood thinners per patient  Pt has  issues with constipation occasionally, takes mg citrate, but mostly uncontrolled diarrhea No A fib or A flutter  EMMI  Information in her packet

## 2018-12-27 MED ORDER — SUPREP BOWEL PREP KIT 17.5-3.13-1.6 GM/177ML PO SOLN: 1.0000 | Freq: Once | ORAL | 0 refills | Status: AC

## 2019-01-02 ENCOUNTER — Telehealth: Payer: Self-pay | Admitting: Internal Medicine

## 2019-01-02 NOTE — Telephone Encounter (Signed)

## 2019-01-03 ENCOUNTER — Ambulatory Visit (AMBULATORY_SURGERY_CENTER): Payer: BC Managed Care – PPO | Admitting: Internal Medicine

## 2019-01-03 ENCOUNTER — Encounter: Payer: Self-pay | Admitting: Internal Medicine

## 2019-01-03 ENCOUNTER — Other Ambulatory Visit: Payer: Self-pay

## 2019-01-03 VITALS — BP 144/65 | HR 64 | Temp 99.4°F | Resp 13 | Ht 66.0 in | Wt 137.0 lb

## 2019-01-03 DIAGNOSIS — D123 Benign neoplasm of transverse colon: Secondary | ICD-10-CM | POA: Diagnosis not present

## 2019-01-03 DIAGNOSIS — Z86010 Personal history of colonic polyps: Secondary | ICD-10-CM

## 2019-01-03 MED ORDER — SODIUM CHLORIDE 0.9 % IV SOLN: 500.0000 mL | Freq: Once | INTRAVENOUS | Status: DC

## 2019-01-03 NOTE — Op Note (Signed)
[place] Patient Name: Jessica Lara Procedure Date: 01/03/2019 1:29 PM MRN: [id number] Endoscopist: Docia Chuck. Henrene Pastor , MD Age: 58 Referring MD:  Date of Birth: 12/14/1960 Gender: Female Account #: [id number] Procedure:                Colonoscopy with cold snare polypectomy x 1 Indications:              High risk colon cancer surveillance: Personal                            history of non-advanced adenoma. Previous exam May                            2015 Medicines:                Monitored Anesthesia Care Procedure:                Pre-Anesthesia Assessment:                           - Prior to the procedure, a History and Physical                            was performed, and patient medications and                            allergies were reviewed. The patient's tolerance of                            previous anesthesia was also reviewed. The risks                            and benefits of the procedure and the sedation                            options and risks were discussed with the patient.                            All questions were answered, and informed consent                            was obtained. Prior Anticoagulants: The patient has                            taken no previous anticoagulant or antiplatelet                            agents. ASA Grade Assessment: II - A patient with                            mild systemic disease. After reviewing the risks                            and benefits, the patient was deemed in  satisfactory condition to undergo the procedure.                           After obtaining informed consent, the colonoscope                            was passed under direct vision. Throughout the                            procedure, the patient's blood pressure, pulse, and                            oxygen saturations were monitored continuously. The                            Colonoscope was introduced  through the anus and                            advanced to the the cecum, identified by                            appendiceal orifice and ileocecal valve. The                            ileocecal valve, appendiceal orifice, and rectum                            were photographed. The quality of the bowel                            preparation was excellent. The colonoscopy was                            performed without difficulty. The patient tolerated                            the procedure well. The bowel preparation used was                            SUPREP via split dose instruction. Scope In: 1:35:55 PM Scope Out: 1:46:40 PM Scope Withdrawal Time: 0 hours 8 minutes 59 seconds  Total Procedure Duration: 0 hours 10 minutes 45 seconds  Findings:                 A 2 mm adenomatous appearing polyp was found in the                            transverse colon. The polyp was removed with a cold                            snare. Resection was complete but no meaningful                            tissue retrieved for submission.  Multiple diverticula were found in the sigmoid                            colon.                           The exam was otherwise without abnormality on                            direct and retroflexion views. Complications:            No immediate complications. Estimated blood loss:                            None. Estimated Blood Loss:     Estimated blood loss: none. Impression:               - One 2 mm polyp in the transverse colon, removed                            with a cold snare. Resected.                           - Diverticulosis in the sigmoid colon.                           - The examination was otherwise normal on direct                            and retroflexion views. Recommendation:           - Repeat colonoscopy in 7 years for surveillance.                           - Patient has a contact number available for                             emergencies. The signs and symptoms of potential                            delayed complications were discussed with the                            patient. Return to normal activities tomorrow.                            Written discharge instructions were provided to the                            patient.                           - Resume previous diet.                           - Continue present medications. Docia Chuck. Henrene Pastor, MD 01/03/2019 1:54:06 PM This report has been signed electronically.

## 2019-01-03 NOTE — Progress Notes (Signed)
Called to room to assist during endoscopic procedure.  Patient ID and intended procedure confirmed with present staff. Received instructions for my participation in the procedure from the performing physician.  

## 2019-01-03 NOTE — Progress Notes (Signed)
PT taken to PACU. Monitors in place. VSS. Report given to RN. 

## 2019-01-03 NOTE — Progress Notes (Signed)
Upon arrival pt. Temp oral was 100.3 and rechecked =99.4,denies any other s/s made Dr. Henrene Pastor aware,recieved communication to proceed with procedure.

## 2019-01-03 NOTE — Progress Notes (Signed)
Pt's states no medical or surgical changes since previsit or office visit.  Jessica Lara temps and Jayme Cloud RN vitals. SM

## 2019-01-03 NOTE — Patient Instructions (Addendum)
YOU HAD AN ENDOSCOPIC PROCEDURE TODAY AT [facility]:   Refer to the procedure report that was given to you for any specific questions about what was found during the examination.  If the procedure report does not answer your questions, please call your gastroenterologist to clarify.  If you requested that your care partner not be given the details of your procedure findings, then the procedure report has been included in a sealed envelope for you to review at your convenience later.  YOU SHOULD EXPECT: Some feelings of bloating in the abdomen. Passage of more gas than usual.  Walking can help get rid of the air that was put into your GI tract during the procedure and reduce the bloating. If you had a lower endoscopy (such as a colonoscopy or flexible sigmoidoscopy) you may notice spotting of blood in your stool or on the toilet paper. If you underwent a bowel prep for your procedure, you may not have a normal bowel movement for a few days.  Please Note:  You might notice some irritation and congestion in your nose or some drainage.  This is from the oxygen used during your procedure.  There is no need for concern and it should clear up in a day or so.  SYMPTOMS TO REPORT IMMEDIATELY:   Following lower endoscopy (colonoscopy or flexible sigmoidoscopy):  Excessive amounts of blood in the stool  Significant tenderness or worsening of abdominal pains  Swelling of the abdomen that is new, acute  Fever of 100F or higher   For urgent or emergent issues, a gastroenterologist can be reached at any hour by calling [phone].   DIET:  We do recommend a small meal at first, but then you may proceed to your regular diet.  Drink plenty of fluids but you should avoid alcoholic beverages for 24 hours.  ACTIVITY:  You should plan to take it easy for the rest of today and you should NOT DRIVE or use heavy machinery until tomorrow (because of the sedation medicines used during the test).     FOLLOW UP: Our staff will call the number listed on your records 48-72 hours following your procedure to check on you and address any questions or concerns that you may have regarding the information given to you following your procedure. If we do not reach you, we will leave a message.  We will attempt to reach you two times.  During this call, we will ask if you have developed any symptoms of COVID 19. If you develop any symptoms (ie: fever, flu-like symptoms, shortness of breath, cough etc.) before then, please call [phone].  If you test positive for Covid 19 in the 2 weeks post procedure, please call and report this information to [place].    If any biopsies were taken you will be contacted by phone or by letter within the next 1-3 weeks.  Please call us at [phone] if you have not heard about the biopsies in 3 weeks.    SIGNATURES/CONFIDENTIALITY: You and/or your care partner have signed paperwork which will be entered into your electronic medical record.  These signatures attest to the fact that that the information above on your After Visit Summary has been reviewed and is understood.  Full responsibility of the confidentiality of this discharge information lies with you and/or your care-partner.    Handouts were given to your care partner on polyps and diverticulosis. Add over the counter fiber supplement. You may resume your current medications today.  Await biopsy results. Repeat colonoscopy in 7 years for surveillance. Please call if any questions or concerns.

## 2019-01-03 NOTE — Progress Notes (Signed)
Dr. Henrene Pastor said to pt to try OTC fiber supplement.  Maw  No problems noted in the recovery room.

## 2019-01-05 ENCOUNTER — Telehealth: Payer: Self-pay | Admitting: *Deleted

## 2019-01-05 NOTE — Telephone Encounter (Signed)
  Follow up Call-  No flowsheet data found.   Patient questions:  Do you have a fever, pain , or abdominal swelling? No. Pain Score  0 *  Have you tolerated food without any problems? Yes.    Have you been able to return to your normal activities? Yes.    Do you have any questions about your discharge instructions: Diet   No. Medications  No. Follow up visit  No.  Do you have questions or concerns about your Care? No.  Actions: * If pain score is 4 or above: No action needed, pain <4.  .cov

## 2019-01-13 ENCOUNTER — Ambulatory Visit: Payer: Self-pay

## 2019-01-13 NOTE — Telephone Encounter (Signed)
Patient called and says she picked up her sister from the emergency room on 01/10/19 and they were in the [place] together for about 15 minutes, her sister wore a mask, but the patient did not. She says her sister came to her house on 01/11/19 for about 5 minutes wearing a mask. She says her sister just found out today that she tested positive for covid while in the ED. The patient asks should she be tested. She says she doesn't have any symptoms. I called the office and spoke to Al[place]da, [facility] who says to let the patient know someone will call her back with a provider's recommendation. I advised the patient, she verbalized understanding.  Answer Assessment - Initial Assessment Questions 1. CLOSE CONTACT: "Who is the person with the confirmed or suspected COVID-19 infection that you were exposed to?"     Sister tested positive 2. PLACE of CONTACT: "Where were you when you were exposed to COVID-19?" (e.g., home, school, medical waiting room; which city?)     [place], [place] 3. TYPE of CONTACT: "How much contact was there?" (e.g., sitting next to, live in same house, work in same office, same building)     Visited at my house 4. DURATION of CONTACT: "How long were you in contact with the COVID-19 patient?" (e.g., a few seconds, passed by person, a few minutes, live with the patient)     5 minutes visiting; rode in same [place] for about 15 minutes wearing mask 5. DATE of CONTACT: "When did you have contact with a COVID-19 patient?" (e.g., how many days ago)     01/10/19 and 01/11/19 6. TRAVEL: "Have you traveled out of the country recently?" If so, "When and where?"     * Also ask about out-of-state travel, since the CDC has identified some high-risk cities for community spread in the [place].     * Note: Travel becomes less relevant if there is widespread community transmission where the patient lives.     No 7. COMMUNITY SPREAD: "Are there lots of cases of COVID-19 (community spread) where you live?" (See public health  department website, if unsure)       No 8. SYMPTOMS: "Do you have any symptoms?" (e.g., fever, cough, breathing difficulty)     No 9. PREGNANCY OR POSTPARTUM: "Is there any chance you are pregnant?" "When was your last menstrual period?" "Did you deliver in the last 2 weeks?"     No 10. HIGH RISK: "Do you have any heart or lung problems? Do you have a weak immune system?" (e.g., CHF, COPD, asthma, HIV positive, chemotherapy, renal failure, diabetes mellitus, sickle cell anemia)      HTN  Protocols used: CORONAVIRUS (COVID-19) EXPOSURE-A-AH

## 2019-01-13 NOTE — Telephone Encounter (Signed)
Please advise regarding below message.  Patient is not experiencing any symptoms.

## 2019-01-16 ENCOUNTER — Telehealth: Payer: Self-pay

## 2019-01-16 ENCOUNTER — Other Ambulatory Visit: Payer: Self-pay | Admitting: Family

## 2019-01-16 DIAGNOSIS — Z20822 Contact with and (suspected) exposure to covid-19: Secondary | ICD-10-CM

## 2019-01-16 MED ORDER — OLMESARTAN MEDOXOMIL-HCTZ 40-25 MG PO TABS: 1.0000 | ORAL_TABLET | Freq: Every day | ORAL | 0 refills | Status: DC

## 2019-01-16 NOTE — Telephone Encounter (Signed)
Yes, please order the test for her;

## 2019-01-16 NOTE — Telephone Encounter (Signed)
Patient contacted and will pick up note today.

## 2019-01-16 NOTE — Telephone Encounter (Signed)
Please contact patient and get her set up for testing.  Reason: Potential exposure  Insurance: [employer]  [id number]   Group # [id number]  Contact number: [phone]

## 2019-01-16 NOTE — Telephone Encounter (Signed)
Patient set for testing tomorrow.

## 2019-01-16 NOTE — Telephone Encounter (Signed)
I will put note on your desk;

## 2019-01-16 NOTE — Telephone Encounter (Signed)
Spoke with patient and test has been scheduled. She will need a note for work stating she is being tested for potential exposure to take to work for tomorrow. She asked about going to work and I did let her know she would need to self-quarantine until she received her test results. Are you okay with providing her a note until test results come back?

## 2019-01-16 NOTE — Telephone Encounter (Signed)
Patient called and advised of the referral for covid testing. Appointment scheduled for tomorrow, 01/17/19 at [place] at [facility], advised of location and to wear a mask for everyone in the vehicle, she verbalized understanding. Order placed.    Marcina Millard, CMA 48 minutes ago (12:59 PM)     Please contact patient and get her set up for testing.  Reason: Potential exposure  Insurance: [employer]  RW#[id number]  Group #[id number]  Contact number: [phone]

## 2019-01-16 NOTE — Telephone Encounter (Signed)
Please contact patient and get her set up for testing.  Reason: Potential exposure  Insurance: [employer]  [id number]   Group # [id number]  Contact number: [phone]

## 2019-01-16 NOTE — Telephone Encounter (Signed)
Pt. Calling back and states she still has not heard from her provider. Please advise.

## 2019-01-17 ENCOUNTER — Other Ambulatory Visit: Payer: BC Managed Care – PPO

## 2019-01-17 DIAGNOSIS — Z20822 Contact with and (suspected) exposure to covid-19: Secondary | ICD-10-CM

## 2019-01-20 LAB — NOVEL CORONAVIRUS, NAA: SARS-CoV-2, NAA: NOT DETECTED

## 2019-01-24 ENCOUNTER — Telehealth: Payer: Self-pay

## 2019-01-24 NOTE — Telephone Encounter (Signed)
Called and spoke with patient. Note is ready for pick up and she will come by office to pick it up today.

## 2019-02-14 ENCOUNTER — Other Ambulatory Visit: Payer: Self-pay | Admitting: Internal Medicine

## 2019-02-14 DIAGNOSIS — F341 Dysthymic disorder: Secondary | ICD-10-CM

## 2019-02-22 ENCOUNTER — Ambulatory Visit: Payer: BC Managed Care – PPO | Admitting: Internal Medicine

## 2019-03-17 ENCOUNTER — Other Ambulatory Visit: Payer: Self-pay | Admitting: Internal Medicine

## 2019-03-17 DIAGNOSIS — F341 Dysthymic disorder: Secondary | ICD-10-CM

## 2019-03-21 ENCOUNTER — Other Ambulatory Visit: Payer: Self-pay | Admitting: Family

## 2019-03-21 ENCOUNTER — Other Ambulatory Visit: Payer: Self-pay | Admitting: Internal Medicine

## 2019-03-21 DIAGNOSIS — F341 Dysthymic disorder: Secondary | ICD-10-CM

## 2019-03-24 MED ORDER — VENLAFAXINE HCL 75 MG PO TABS: 75.0000 mg | ORAL_TABLET | Freq: Every day | ORAL | 1 refills | Status: DC

## 2019-03-24 NOTE — Telephone Encounter (Signed)
Patient has an appt on Monday, she would like this filled today

## 2019-03-24 NOTE — Telephone Encounter (Signed)
Done erx 

## 2019-03-24 NOTE — Addendum Note (Signed)
Addended by: Biagio Borg on: 03/24/2019 01:03 PM   Modules accepted: Orders

## 2019-03-27 ENCOUNTER — Ambulatory Visit: Payer: BC Managed Care – PPO | Admitting: Internal Medicine

## 2019-04-10 ENCOUNTER — Other Ambulatory Visit: Payer: Self-pay | Admitting: Internal Medicine

## 2019-04-10 ENCOUNTER — Encounter: Payer: Self-pay | Admitting: Internal Medicine

## 2019-04-10 ENCOUNTER — Ambulatory Visit (INDEPENDENT_AMBULATORY_CARE_PROVIDER_SITE_OTHER): Payer: BC Managed Care – PPO | Admitting: Internal Medicine

## 2019-04-10 ENCOUNTER — Other Ambulatory Visit: Payer: Self-pay

## 2019-04-10 ENCOUNTER — Other Ambulatory Visit (INDEPENDENT_AMBULATORY_CARE_PROVIDER_SITE_OTHER): Payer: BC Managed Care – PPO

## 2019-04-10 VITALS — BP 138/86 | HR 99 | Temp 98.4°F | Ht 66.0 in | Wt 141.0 lb

## 2019-04-10 DIAGNOSIS — E538 Deficiency of other specified B group vitamins: Secondary | ICD-10-CM | POA: Diagnosis not present

## 2019-04-10 DIAGNOSIS — E611 Iron deficiency: Secondary | ICD-10-CM

## 2019-04-10 DIAGNOSIS — E559 Vitamin D deficiency, unspecified: Secondary | ICD-10-CM

## 2019-04-10 DIAGNOSIS — R739 Hyperglycemia, unspecified: Secondary | ICD-10-CM

## 2019-04-10 DIAGNOSIS — F341 Dysthymic disorder: Secondary | ICD-10-CM

## 2019-04-10 DIAGNOSIS — Z0000 Encounter for general adult medical examination without abnormal findings: Secondary | ICD-10-CM | POA: Diagnosis not present

## 2019-04-10 DIAGNOSIS — E781 Pure hyperglyceridemia: Secondary | ICD-10-CM

## 2019-04-10 DIAGNOSIS — G4700 Insomnia, unspecified: Secondary | ICD-10-CM

## 2019-04-10 DIAGNOSIS — I10 Essential (primary) hypertension: Secondary | ICD-10-CM

## 2019-04-10 DIAGNOSIS — F17200 Nicotine dependence, unspecified, uncomplicated: Secondary | ICD-10-CM

## 2019-04-10 LAB — HEPATIC FUNCTION PANEL
ALT: 20 U/L (ref 0–35)
AST: 16 U/L (ref 0–37)
Albumin: 4.8 g/dL (ref 3.5–5.2)
Alkaline Phosphatase: 80 U/L (ref 39–117)
Bilirubin, Direct: 0 mg/dL (ref 0.0–0.3)
Total Bilirubin: 0.4 mg/dL (ref 0.2–1.2)
Total Protein: 8.1 g/dL (ref 6.0–8.3)

## 2019-04-10 LAB — CBC WITH DIFFERENTIAL/PLATELET
Basophils Absolute: 0.1 10*3/uL (ref 0.0–0.1)
Basophils Relative: 1 % (ref 0.0–3.0)
Eosinophils Absolute: 0.1 10*3/uL (ref 0.0–0.7)
Eosinophils Relative: 0.8 % (ref 0.0–5.0)
HCT: 37.8 % (ref 36.0–46.0)
Hemoglobin: 12.9 g/dL (ref 12.0–15.0)
Lymphocytes Relative: 24.9 % (ref 12.0–46.0)
Lymphs Abs: 1.8 10*3/uL (ref 0.7–4.0)
MCHC: 34.2 g/dL (ref 30.0–36.0)
MCV: 88.5 fl (ref 78.0–100.0)
Monocytes Absolute: 0.6 10*3/uL (ref 0.1–1.0)
Monocytes Relative: 8 % (ref 3.0–12.0)
Neutro Abs: 4.7 10*3/uL (ref 1.4–7.7)
Neutrophils Relative %: 65.3 % (ref 43.0–77.0)
Platelets: 280 10*3/uL (ref 150.0–400.0)
RBC: 4.27 Mil/uL (ref 3.87–5.11)
RDW: 13.8 % (ref 11.5–15.5)
WBC: 7.2 10*3/uL (ref 4.0–10.5)

## 2019-04-10 LAB — URINALYSIS, ROUTINE W REFLEX MICROSCOPIC
Bilirubin Urine: NEGATIVE
Hgb urine dipstick: NEGATIVE
Ketones, ur: NEGATIVE
Leukocytes,Ua: NEGATIVE
Nitrite: NEGATIVE
RBC / HPF: NONE SEEN (ref 0–?)
Specific Gravity, Urine: 1.01 (ref 1.000–1.030)
Total Protein, Urine: NEGATIVE
Urine Glucose: NEGATIVE
Urobilinogen, UA: 0.2 (ref 0.0–1.0)
WBC, UA: NONE SEEN (ref 0–?)
pH: 6 (ref 5.0–8.0)

## 2019-04-10 LAB — BASIC METABOLIC PANEL
BUN: 16 mg/dL (ref 6–23)
CO2: 25 mEq/L (ref 19–32)
Calcium: 10.2 mg/dL (ref 8.4–10.5)
Chloride: 101 mEq/L (ref 96–112)
Creatinine, Ser: 0.74 mg/dL (ref 0.40–1.20)
GFR: 97.54 mL/min (ref >60.00)
Glucose, Bld: 123 mg/dL — ABNORMAL HIGH (ref 70–99)
Potassium: 3.3 mEq/L — ABNORMAL LOW (ref 3.5–5.1)
Sodium: 139 mEq/L (ref 135–145)

## 2019-04-10 LAB — LIPID PANEL
Cholesterol: 203 mg/dL — ABNORMAL HIGH (ref 0–200)
HDL: 34.2 mg/dL — ABNORMAL LOW (ref >39.00)
Total CHOL/HDL Ratio: 6
Triglycerides: 654 mg/dL — ABNORMAL HIGH (ref 0.0–149.0)

## 2019-04-10 LAB — IBC PANEL
Iron: 108 ug/dL (ref 42–145)
Saturation Ratios: 25.5 % (ref 20.0–50.0)
Transferrin: 302 mg/dL (ref 212.0–360.0)

## 2019-04-10 LAB — VITAMIN B12: Vitamin B-12: 400 pg/mL (ref 211–911)

## 2019-04-10 LAB — LDL CHOLESTEROL, DIRECT: Direct LDL: 72 mg/dL

## 2019-04-10 LAB — HEMOGLOBIN A1C: Hgb A1c MFr Bld: 7.4 % — ABNORMAL HIGH (ref 4.6–6.5)

## 2019-04-10 LAB — VITAMIN D 25 HYDROXY (VIT D DEFICIENCY, FRACTURES): VITD: 15.08 ng/mL — ABNORMAL LOW (ref 30.00–100.00)

## 2019-04-10 LAB — TSH: TSH: 1.97 u[IU]/mL (ref 0.35–4.50)

## 2019-04-10 MED ORDER — FENOFIBRATE 160 MG PO TABS: 160.0000 mg | ORAL_TABLET | Freq: Every day | ORAL | 3 refills | Status: DC

## 2019-04-10 MED ORDER — ZOLPIDEM TARTRATE 10 MG PO TABS: 10.0000 mg | ORAL_TABLET | Freq: Every evening | ORAL | 5 refills | Status: DC | PRN

## 2019-04-10 MED ORDER — VITAMIN D (ERGOCALCIFEROL) 1.25 MG (50000 UNIT) PO CAPS: 50000.0000 [IU] | ORAL_CAPSULE | ORAL | 0 refills | Status: DC

## 2019-04-10 MED ORDER — POTASSIUM CHLORIDE ER 10 MEQ PO TBCR: 10.0000 meq | EXTENDED_RELEASE_TABLET | Freq: Every day | ORAL | 3 refills | Status: DC

## 2019-04-10 MED ORDER — OLMESARTAN MEDOXOMIL-HCTZ 40-25 MG PO TABS: 1.0000 | ORAL_TABLET | Freq: Every day | ORAL | 3 refills | Status: DC

## 2019-04-10 MED ORDER — VENLAFAXINE HCL ER 150 MG PO CP24: 150.0000 mg | ORAL_CAPSULE | Freq: Every day | ORAL | 3 refills | Status: DC

## 2019-04-10 NOTE — Patient Instructions (Addendum)
You had the flu shot today  Ok to increase the effexor to 150 mg per day  Ok to change the [place] to [place] as needed for sleep  Please continue all other medications as before, and refills have been done if requested.  Please have the pharmacy call with any other refills you may need.  Please continue your efforts at being more active, low cholesterol diet, and weight control.  You are otherwise up to date with prevention measures today.  Please keep your appointments with your specialists as you may have planned  Please go to the LAB in the Basement (turn left off the elevator) for the tests to be done today  You will be contacted by phone if any changes need to be made immediately.  Otherwise, you will receive a letter about your results with an explanation, but please check with MyChart first.  Please remember to sign up for MyChart if you have not done so, as this will be important to you in the future with finding out test results, communicating by private email, and scheduling acute appointments online when needed.  Please return in 6 months, or sooner if needed

## 2019-04-10 NOTE — Assessment & Plan Note (Signed)
Urged to quit 

## 2019-04-10 NOTE — Assessment & Plan Note (Signed)
stable overall by history and exam, recent data reviewed with pt, and pt to continue medical treatment as before,  to f/u any worsening symptoms or concerns  

## 2019-04-10 NOTE — Progress Notes (Signed)
Subjective:    Patient ID: Jessica Lara, female    DOB: 21-Jul-1960, 58 y.o.   MRN: [id number]  HPI  Here for wellness and f/u;  Overall doing ok;  Pt denies Chest pain, worsening SOB, DOE, wheezing, orthopnea, PND, worsening LE edema, palpitations, dizziness or syncope.  Pt denies neurological change such as new headache, facial or extremity weakness.  Pt denies polydipsia, polyuria, or low sugar symptoms. Pt states overall good compliance with treatment and medications, good tolerability, and has been trying to follow appropriate diet.  Pt denies worsening depressive symptoms, suicidal ideation or panic. No fever, night sweats, wt loss, loss of appetite, or other constitutional symptoms.  Pt states good ability with ADL's, has low fall risk, home safety reviewed and adequate, no other significant changes in hearing or vision, and only occasionally active with exercise. Also + depressoin, uses best friend for counseling, asks for increased effexor.  Also + insomnia, lunesta not working Daughter and sister with covid but pt tested neg July 2020  No other new complaints Past Medical History:  Diagnosis Date  . Anxiety   . ANXIETY DEPRESSION   . Arthritis   . Chicken pox   . H/O: hysterectomy   . Hypertension   . Other and unspecified hyperlipidemia   . SMOKER   . Unspecified essential hypertension    Past Surgical History:  Procedure Laterality Date  . ABDOMINAL HYSTERECTOMY  1999   fibroids  . COLONOSCOPY    . ESOPHAGOGASTRODUODENOSCOPY  01/09/2012   Procedure: ESOPHAGOGASTRODUODENOSCOPY (EGD);  Surgeon: Irene Shipper, MD;  Location: [facility] ENDOSCOPY;  Service: Endoscopy;  Laterality: N/A;  . single oopherectomy  1999    reports that she has been smoking cigarettes. She has a 33.00 pack-year smoking history. She has never used smokeless tobacco. She reports current alcohol use of about 2.0 standard drinks of alcohol per week. She reports that she does not use drugs. family history includes  Diabetes in her brother and mother; Heart disease in her sister; Hyperlipidemia in her mother; Hypertension in her brother and mother; Stomach cancer in her father. Allergies  Allergen Reactions  . Lisinopril     cough   Current Outpatient Medications on File Prior to Visit  Medication Sig Dispense Refill  . gabapentin (NEURONTIN) 100 MG capsule TAKE 2 CAPSULES (200 MG TOTAL) BY MOUTH AT BEDTIME. 180 capsule 1  . Icosapent Ethyl 1 g CAPS Take 2 capsules (2 g total) by mouth 2 (two) times daily. 120 capsule 6   No current facility-administered medications on file prior to visit.    Review of Systems Constitutional: Negative for other unusual diaphoresis, sweats, appetite or weight changes HENT: Negative for other worsening hearing loss, ear pain, facial swelling, mouth sores or neck stiffness.   Eyes: Negative for other worsening pain, redness or other visual disturbance.  Respiratory: Negative for other stridor or swelling Cardiovascular: Negative for other palpitations or other chest pain  Gastrointestinal: Negative for worsening diarrhea or loose stools, blood in stool, distention or other pain Genitourinary: Negative for hematuria, flank pain or other change in urine volume.  Musculoskeletal: Negative for myalgias or other joint swelling.  Skin: Negative for other color change, or other wound or worsening drainage.  Neurological: Negative for other syncope or numbness. Hematological: Negative for other adenopathy or swelling Psychiatric/Behavioral: Negative for hallucinations, other worsening agitation, SI, self-injury, or new decreased concentration All otherwise neg per pt     Objective:   Physical Exam BP 138/86  Pulse 99   Temp 98.4 F (36.9 C) (Oral)   Ht 5\' 6"  (1.676 m)   Wt 141 lb (64 kg)   SpO2 97%   BMI 22.76 kg/m  VS noted,  Constitutional: Pt is oriented to person, place, and time. Appears well-developed and well-nourished, in no significant distress and  comfortable Head: Normocephalic and atraumatic  Eyes: Conjunctivae and EOM are normal. Pupils are equal, round, and reactive to light Right Ear: External ear normal without discharge Left Ear: External ear normal without discharge Nose: Nose without discharge or deformity Mouth/Throat: Oropharynx is without other ulcerations and moist  Neck: Normal range of motion. Neck supple. No JVD present. No tracheal deviation present or significant neck LA or mass Cardiovascular: Normal rate, regular rhythm, normal heart sounds and intact distal pulses.   Pulmonary/Chest: WOB normal and breath sounds without rales or wheezing  Abdominal: Soft. Bowel sounds are normal. NT. No HSM  Musculoskeletal: Normal range of motion. Exhibits no edema Lymphadenopathy: Has no other cervical adenopathy.  Neurological: Pt is alert and oriented to person, place, and time. Pt has normal reflexes. No cranial nerve deficit. Motor grossly intact, Gait intact Skin: Skin is warm and dry. No rash noted or new ulcerations Psychiatric:  Has normal mood and affect. Behavior is normal without agitation No other exam findings Lab Results  Component Value Date   WBC 7.2 04/10/2019   HGB 12.9 04/10/2019   HCT 37.8 04/10/2019   PLT 280.0 04/10/2019   GLUCOSE 123 (H) 04/10/2019   CHOL 203 (H) 04/10/2019   TRIG (H) 04/10/2019    654.0 Triglyceride is over 400; calculations on Lipids are invalid.   HDL 34.20 (L) 04/10/2019   LDLDIRECT 72.0 04/10/2019   ALT 20 04/10/2019   AST 16 04/10/2019   NA 139 04/10/2019   K 3.3 (L) 04/10/2019   CL 101 04/10/2019   CREATININE 0.74 04/10/2019   BUN 16 04/10/2019   CO2 25 04/10/2019   TSH 1.97 04/10/2019   INR 1.05 01/09/2012   HGBA1C 7.4 (H) 04/10/2019      Assessment & Plan:

## 2019-04-10 NOTE — Assessment & Plan Note (Signed)

## 2019-04-10 NOTE — Assessment & Plan Note (Signed)
Ok for change lunesta to [employer] qhs prn

## 2019-04-10 NOTE — Assessment & Plan Note (Signed)
For f/u trigs, consider fenofibrate

## 2019-04-10 NOTE — Assessment & Plan Note (Signed)
[place] for increased effexor 150 qd, declines counseling referral

## 2019-06-26 ENCOUNTER — Other Ambulatory Visit: Payer: Self-pay | Admitting: Internal Medicine

## 2019-06-26 NOTE — Telephone Encounter (Signed)
No need contd high dose vit d  then plan to change to OTC Vitamin D3 at 2000 units per day, indefinitely.

## 2019-06-27 NOTE — Telephone Encounter (Signed)
Called pt there was no answer LMOM w/MD response../lmb 

## 2019-08-01 ENCOUNTER — Ambulatory Visit: Payer: BC Managed Care – PPO | Attending: Internal Medicine

## 2019-08-01 DIAGNOSIS — Z20822 Contact with and (suspected) exposure to covid-19: Secondary | ICD-10-CM

## 2019-08-02 LAB — NOVEL CORONAVIRUS, NAA: SARS-CoV-2, NAA: DETECTED — AB

## 2019-08-03 ENCOUNTER — Telehealth: Payer: Self-pay | Admitting: Nurse Practitioner

## 2019-08-03 NOTE — Telephone Encounter (Signed)
Called to Discuss with patient about Covid symptoms and the use of bamlanivimab, a monoclonal antibody infusion for those with mild to moderate Covid symptoms and at a high risk of hospitalization.     Pt is qualified for this infusion at the [facility] center due to co-morbid conditions and/or a member of an at-risk group.     Unable to reach pt  

## 2019-10-16 ENCOUNTER — Ambulatory Visit: Payer: BC Managed Care – PPO | Admitting: Family

## 2019-10-16 DIAGNOSIS — Z0289 Encounter for other administrative examinations: Secondary | ICD-10-CM

## 2019-12-15 ENCOUNTER — Other Ambulatory Visit: Payer: Self-pay | Admitting: Internal Medicine

## 2019-12-16 NOTE — Telephone Encounter (Signed)
Done erx 

## 2020-03-03 ENCOUNTER — Other Ambulatory Visit: Payer: Self-pay | Admitting: Internal Medicine

## 2020-03-03 NOTE — Telephone Encounter (Signed)
Please refill as per office routine med refill policy (all routine meds refilled for 3 mo or monthly per pt preference up to one year from last visit, then month to month grace period for 3 mo, then further med refills will have to be denied)  

## 2020-04-03 ENCOUNTER — Other Ambulatory Visit: Payer: Self-pay | Admitting: Internal Medicine

## 2020-04-03 NOTE — Telephone Encounter (Signed)
Please refill as per office routine med refill policy (all routine meds refilled for 3 mo or monthly per pt preference up to one year from last visit, then month to month grace period for 3 mo, then further med refills will have to be denied)  

## 2020-04-19 ENCOUNTER — Other Ambulatory Visit: Payer: Self-pay | Admitting: Internal Medicine

## 2020-04-19 NOTE — Telephone Encounter (Signed)
Please refill as per office routine med refill policy (all routine meds refilled for 3 mo or monthly per pt preference up to one year from last visit, then month to month grace period for 3 mo, then further med refills will have to be denied)  

## 2020-04-20 ENCOUNTER — Other Ambulatory Visit: Payer: Self-pay | Admitting: Internal Medicine

## 2020-04-20 NOTE — Telephone Encounter (Signed)
effexor done 1 mo only  Please to ask pt to make ROV

## 2020-04-30 ENCOUNTER — Telehealth: Payer: Self-pay | Admitting: Internal Medicine

## 2020-04-30 NOTE — Telephone Encounter (Signed)
venlafaxine XR (EFFEXOR-XR) 150 MG 24 hr capsule [id number] - [place], [place] - [address] Phone:  [phone]  Fax:  [phone]

## 2020-05-07 ENCOUNTER — Other Ambulatory Visit: Payer: Self-pay

## 2020-05-07 MED ORDER — VENLAFAXINE HCL ER 150 MG PO CP24: 150.0000 mg | ORAL_CAPSULE | Freq: Every day | ORAL | 0 refills | Status: DC

## 2020-05-25 ENCOUNTER — Other Ambulatory Visit: Payer: Self-pay | Admitting: Internal Medicine

## 2020-05-25 NOTE — Telephone Encounter (Signed)
effexor refilled 1 mo only  Ok to let pt know - due for rov fur further refills

## 2020-05-30 ENCOUNTER — Other Ambulatory Visit: Payer: Self-pay | Admitting: Internal Medicine

## 2020-06-03 MED ORDER — VENLAFAXINE HCL ER 150 MG PO CP24
150.0000 mg | ORAL_CAPSULE | Freq: Every day | ORAL | 0 refills | Status: DC
Start: 2020-06-03 — End: 2020-06-19

## 2020-06-03 NOTE — Telephone Encounter (Signed)
effexor done 1 mo only  Please let pt know -due for rov for further refills

## 2020-06-13 ENCOUNTER — Ambulatory Visit: Payer: BC Managed Care – PPO | Admitting: Internal Medicine

## 2020-06-13 DIAGNOSIS — Z0289 Encounter for other administrative examinations: Secondary | ICD-10-CM

## 2020-06-18 ENCOUNTER — Other Ambulatory Visit: Payer: Self-pay | Admitting: Internal Medicine

## 2020-06-19 NOTE — Telephone Encounter (Signed)
effexor is refilled  Please let pt I will not be able to refill any further due to office refill policy after this;  Pt should make ROV for further refills

## 2020-06-25 ENCOUNTER — Other Ambulatory Visit: Payer: Self-pay | Admitting: Internal Medicine

## 2020-06-25 ENCOUNTER — Ambulatory Visit: Payer: BC Managed Care – PPO | Admitting: Internal Medicine

## 2020-06-25 NOTE — Telephone Encounter (Signed)
Ok to contact pt - effexor x 1 mo is last refill per office refill policy  Please make rov for further refills

## 2020-07-02 ENCOUNTER — Other Ambulatory Visit: Payer: Self-pay | Admitting: Internal Medicine

## 2020-07-03 NOTE — Telephone Encounter (Signed)
1 mo effexor done refill  Please to let pt know- needs ROV for further refills due to office refill policy

## 2020-07-10 ENCOUNTER — Ambulatory Visit: Payer: BC Managed Care – PPO | Admitting: Internal Medicine

## 2020-07-10 DIAGNOSIS — Z0289 Encounter for other administrative examinations: Secondary | ICD-10-CM

## 2020-11-11 ENCOUNTER — Encounter (HOSPITAL_COMMUNITY): Payer: Self-pay | Admitting: Registered Nurse

## 2020-11-11 ENCOUNTER — Ambulatory Visit (HOSPITAL_COMMUNITY)
Admission: EM | Admit: 2020-11-11 | Discharge: 2020-11-11 | Disposition: A | Discharge status: Other Acute Inpatient Hospital | Payer: No Payment, Other | Attending: Family | Admitting: Family

## 2020-11-11 ENCOUNTER — Other Ambulatory Visit: Payer: Self-pay

## 2020-11-11 DIAGNOSIS — F411 Generalized anxiety disorder: Secondary | ICD-10-CM | POA: Insufficient documentation

## 2020-11-11 DIAGNOSIS — F332 Major depressive disorder, recurrent severe without psychotic features: Secondary | ICD-10-CM | POA: Insufficient documentation

## 2020-11-11 DIAGNOSIS — G479 Sleep disorder, unspecified: Secondary | ICD-10-CM | POA: Insufficient documentation

## 2020-11-11 DIAGNOSIS — Z9114 Patient's other noncompliance with medication regimen: Secondary | ICD-10-CM | POA: Insufficient documentation

## 2020-11-11 DIAGNOSIS — F322 Major depressive disorder, single episode, severe without psychotic features: Secondary | ICD-10-CM

## 2020-11-11 MED ORDER — HYDROXYZINE HCL 25 MG PO TABS: 25.0000 mg | ORAL_TABLET | Freq: Four times a day (QID) | ORAL | 0 refills | Status: DC | PRN

## 2020-11-11 MED ORDER — HYDROXYZINE HCL 25 MG PO TABS: 25.0000 mg | ORAL_TABLET | Freq: Four times a day (QID) | ORAL | Status: DC | PRN

## 2020-11-11 MED ORDER — VENLAFAXINE HCL 37.5 MG PO TABS: 37.5000 mg | ORAL_TABLET | Freq: Two times a day (BID) | ORAL | Status: DC

## 2020-11-11 MED ORDER — GABAPENTIN 100 MG PO CAPS: 200.0000 mg | ORAL_CAPSULE | Freq: Every day | ORAL | 1 refills | Status: DC

## 2020-11-11 MED ORDER — VENLAFAXINE HCL 37.5 MG PO TABS: 37.5000 mg | ORAL_TABLET | Freq: Two times a day (BID) | ORAL | 0 refills | Status: DC

## 2020-11-11 NOTE — ED Provider Notes (Signed)
Behavioral Health Urgent Care Medical Screening Exam  Patient Name: Jessica Lara MRN: [id number] Date of Evaluation: 11/11/20 Chief Complaint:   Diagnosis:  Final diagnoses:  MDD (major depressive disorder), severe ([place])    History of Present illness: Jessica Lara is a 60 y.o. female.  Presents to [facility] urgent care requesting to be restarted on her medications.  She reports she was followed by [facility].  Patient is unable to recall which medication she was taking.  States has been off her medications for the past 3 to 4 months.  She denied suicidal or homicidal ideations.  Denies auditory or visual hallucinations.  Chart reviewed: Patient diagnosed with major depressive disorder, sleep disturbance and generalized anxiety.  She was prescribed Effexor 150 mg and Ambien 5 mg p.o. nightly  Will restart Effexor 37.5 mg p.o. twice daily and hydroxyzine for anxiety/sleep.  Patient was provided with additional outpatient resources for medication management with open access and family services of the [place].  Patient was receptive to plan.  Support, encouragement and reassurance was provided.     Psychiatric Specialty Exam  Presentation  General Appearance:Appropriate for Environment  Eye Contact:Good  Speech:Clear and Coherent  Speech Volume:Normal  Handedness:Right   Mood and Affect  Mood:Anxious; Depressed; Irritable  Affect:Congruent   Thought Process  Thought Processes:Coherent  Descriptions of Associations:Intact  Orientation:Full (Time, Place and Person)  Thought Content:Logical    Hallucinations:None  Ideas of Reference:None  Suicidal Thoughts:No  Homicidal Thoughts:No   Sensorium  Memory:Immediate Good; Recent Good; Remote Good  Judgment:Good  Insight:Fair   Executive Functions  Concentration:Fair  Attention Span:Fair  [place]  Language:Good   Psychomotor Activity  Psychomotor  Activity:Normal   Assets  Assets:Social Support; Desire for Improvement   Sleep  Sleep:Good  Number of hours: No data recorded  Nutritional Assessment (For OBS and FBC admissions only) Has the patient had a weight loss or gain of 10 pounds or more in the last 3 months?: No Has the patient had a decrease in food intake/or appetite?: No Does the patient have dental problems?: No Does the patient have eating habits or behaviors that may be indicators of an eating disorder including binging or inducing vomiting?: No Has the patient recently lost weight without trying?: No Has the patient been eating poorly because of a decreased appetite?: No Malnutrition Screening Tool Score: 0    Physical Exam: Physical Exam Cardiovascular:     Rate and Rhythm: Normal rate and regular rhythm.  Neurological:     Mental Status: She is alert.  Psychiatric:        Attention and Perception: Attention normal.        Mood and Affect: Mood is anxious and depressed.        Speech: Speech normal.        Behavior: Behavior is agitated.        Thought Content: Thought content normal.        Cognition and Memory: Cognition normal.        Judgment: Judgment normal.    ROS Blood pressure (!) 143/75, pulse 80, temperature 98.8 F (37.1 C), temperature source Oral, resp. rate 18, SpO2 99 %. There is no height or weight on file to calculate BMI.  Musculoskeletal: Strength & Muscle Tone: within normal limits Gait & Station: normal Patient leans: N/A   [place] MSE Discharge Disposition for Follow up and Recommendations: Based on my evaluation the patient does not appear to have an emergency  medical condition and can be discharged with resources and follow up care in outpatient services for Medication Management and Individual Therapy   Patient was provided with a prescription for Effexor 37.5 mg p.o. twice daily -Continue hydroxyzine 25 mg p.o. nightly as needed -Continue gabapentin 200 mg  nightly   Derrill [place], NP 11/11/2020, 2:46 PM

## 2020-11-11 NOTE — BH Assessment (Signed)
Pt to [facility] voluntarily with chief complaint of feeling stressed and needing medication for her anxiety. Pt unable to recall name of medications but reports that she has been off medications for the past 3-4 months. Pt states she can not afford her medications and now having symptoms of racing thoughts, constant crying and increased anxiety. Pt is very tearful and anxious. Pt denies SI, HI, AVH. Reports SI about a month ago and history of ETOH and THC use.   Pt is routine.

## 2020-11-11 NOTE — Discharge Instructions (Signed)
Take all medications as prescribed. Keep all follow-up appointments as scheduled.  Do not consume alcohol or use illegal drugs while on prescription medications. Report any adverse effects from your medications to your primary care provider promptly.  In the event of recurrent symptoms or worsening symptoms, call 911, a crisis hotline, or go to the nearest emergency department for evaluation.   

## 2020-11-15 ENCOUNTER — Telehealth (HOSPITAL_COMMUNITY): Payer: Self-pay | Admitting: Internal Medicine

## 2020-11-15 NOTE — BH Assessment (Signed)
Care Management - Follow Up [facility] Discharges   Writer made contact with the patient.  Patient reports that she was able to follow up with her established provider.

## 2020-11-28 ENCOUNTER — Emergency Department (HOSPITAL_COMMUNITY): Payer: Self-pay

## 2020-11-28 ENCOUNTER — Encounter (HOSPITAL_COMMUNITY): Payer: Self-pay | Admitting: Emergency Medicine

## 2020-11-28 ENCOUNTER — Inpatient Hospital Stay (HOSPITAL_COMMUNITY)
Admission: EM | Admit: 2020-11-28 | Discharge: 2020-12-02 | DRG: 638 | Disposition: A | Discharge status: Home / Self Care | Payer: Self-pay | Attending: Internal Medicine | Admitting: Internal Medicine

## 2020-11-28 ENCOUNTER — Other Ambulatory Visit: Payer: Self-pay

## 2020-11-28 DIAGNOSIS — E1110 Type 2 diabetes mellitus with ketoacidosis without coma: Secondary | ICD-10-CM | POA: Insufficient documentation

## 2020-11-28 DIAGNOSIS — F331 Major depressive disorder, recurrent, moderate: Secondary | ICD-10-CM

## 2020-11-28 DIAGNOSIS — E871 Hypo-osmolality and hyponatremia: Secondary | ICD-10-CM | POA: Diagnosis present

## 2020-11-28 DIAGNOSIS — E785 Hyperlipidemia, unspecified: Secondary | ICD-10-CM | POA: Diagnosis present

## 2020-11-28 DIAGNOSIS — E876 Hypokalemia: Secondary | ICD-10-CM | POA: Diagnosis present

## 2020-11-28 DIAGNOSIS — Z87891 Personal history of nicotine dependence: Secondary | ICD-10-CM

## 2020-11-28 DIAGNOSIS — F341 Dysthymic disorder: Secondary | ICD-10-CM | POA: Diagnosis present

## 2020-11-28 DIAGNOSIS — I10 Essential (primary) hypertension: Secondary | ICD-10-CM | POA: Diagnosis present

## 2020-11-28 DIAGNOSIS — F419 Anxiety disorder, unspecified: Secondary | ICD-10-CM | POA: Diagnosis present

## 2020-11-28 DIAGNOSIS — E119 Type 2 diabetes mellitus without complications: Secondary | ICD-10-CM

## 2020-11-28 DIAGNOSIS — R45851 Suicidal ideations: Secondary | ICD-10-CM | POA: Diagnosis present

## 2020-11-28 DIAGNOSIS — Z79899 Other long term (current) drug therapy: Secondary | ICD-10-CM

## 2020-11-28 DIAGNOSIS — Z83438 Family history of other disorder of lipoprotein metabolism and other lipidemia: Secondary | ICD-10-CM

## 2020-11-28 DIAGNOSIS — R739 Hyperglycemia, unspecified: Secondary | ICD-10-CM | POA: Diagnosis present

## 2020-11-28 DIAGNOSIS — Z8249 Family history of ischemic heart disease and other diseases of the circulatory system: Secondary | ICD-10-CM

## 2020-11-28 DIAGNOSIS — E1165 Type 2 diabetes mellitus with hyperglycemia: Secondary | ICD-10-CM

## 2020-11-28 DIAGNOSIS — E1100 Type 2 diabetes mellitus with hyperosmolarity without nonketotic hyperglycemic-hyperosmolar coma (NKHHC): Principal | ICD-10-CM

## 2020-11-28 DIAGNOSIS — R55 Syncope and collapse: Secondary | ICD-10-CM | POA: Diagnosis present

## 2020-11-28 DIAGNOSIS — Z888 Allergy status to other drugs, medicaments and biological substances status: Secondary | ICD-10-CM

## 2020-11-28 DIAGNOSIS — Z833 Family history of diabetes mellitus: Secondary | ICD-10-CM

## 2020-11-28 DIAGNOSIS — Z20822 Contact with and (suspected) exposure to covid-19: Secondary | ICD-10-CM | POA: Diagnosis present

## 2020-11-28 LAB — BASIC METABOLIC PANEL
Anion gap: 12 (ref 5–15)
Anion gap: 12 (ref 5–15)
Anion gap: 8 (ref 5–15)
BUN: 19 mg/dL (ref 6–20)
BUN: 13 mg/dL (ref 6–20)
BUN: 10 mg/dL (ref 6–20)
CO2: 26 mmol/L (ref 22–32)
CO2: 26 mmol/L (ref 22–32)
CO2: 28 mmol/L (ref 22–32)
Calcium: 10 mg/dL (ref 8.9–10.3)
Calcium: 9.8 mg/dL (ref 8.9–10.3)
Calcium: 9.5 mg/dL (ref 8.9–10.3)
Chloride: 88 mmol/L — ABNORMAL LOW (ref 98–111)
Chloride: 100 mmol/L (ref 98–111)
Chloride: 101 mmol/L (ref 98–111)
Creatinine, Ser: 1.08 mg/dL — ABNORMAL HIGH (ref 0.44–1.00)
Creatinine, Ser: 0.63 mg/dL (ref 0.44–1.00)
Creatinine, Ser: 0.67 mg/dL (ref 0.44–1.00)
GFR, Estimated: 59 mL/min — ABNORMAL LOW (ref >60)
GFR, Estimated: >60 mL/min (ref >60)
GFR, Estimated: >60 mL/min (ref >60)
Glucose, Bld: 809 mg/dL (ref 70–99)
Glucose, Bld: 209 mg/dL — ABNORMAL HIGH (ref 70–99)
Glucose, Bld: 194 mg/dL — ABNORMAL HIGH (ref 70–99)
Potassium: 4.4 mmol/L (ref 3.5–5.1)
Potassium: 3.2 mmol/L — ABNORMAL LOW (ref 3.5–5.1)
Potassium: 3.1 mmol/L — ABNORMAL LOW (ref 3.5–5.1)
Sodium: 126 mmol/L — ABNORMAL LOW (ref 135–145)
Sodium: 138 mmol/L (ref 135–145)
Sodium: 137 mmol/L (ref 135–145)

## 2020-11-28 LAB — RAPID URINE DRUG SCREEN, HOSP PERFORMED
Amphetamines: NOT DETECTED
Barbiturates: NOT DETECTED
Benzodiazepines: NOT DETECTED
Cocaine: NOT DETECTED
Opiates: NOT DETECTED
Tetrahydrocannabinol: POSITIVE — AB

## 2020-11-28 LAB — URINALYSIS, ROUTINE W REFLEX MICROSCOPIC
Bacteria, UA: NONE SEEN
Bilirubin Urine: NEGATIVE
Glucose, UA: >=500 mg/dL — AB
Hgb urine dipstick: NEGATIVE
Ketones, ur: NEGATIVE mg/dL
Leukocytes,Ua: NEGATIVE
Nitrite: NEGATIVE
Protein, ur: NEGATIVE mg/dL
Specific Gravity, Urine: 1.018 (ref 1.005–1.030)
pH: 7 (ref 5.0–8.0)

## 2020-11-28 LAB — RESP PANEL BY RT-PCR (FLU A&B, COVID) ARPGX2
Influenza A by PCR: NEGATIVE
Influenza B by PCR: NEGATIVE
SARS Coronavirus 2 by RT PCR: NEGATIVE

## 2020-11-28 LAB — HEPATIC FUNCTION PANEL
ALT: 20 U/L (ref 0–44)
AST: 20 U/L (ref 15–41)
Albumin: 4.2 g/dL (ref 3.5–5.0)
Alkaline Phosphatase: 79 U/L (ref 38–126)
Bilirubin, Direct: <0.1 mg/dL (ref 0.0–0.2)
Total Bilirubin: 0.3 mg/dL (ref 0.3–1.2)
Total Protein: 8.3 g/dL — ABNORMAL HIGH (ref 6.5–8.1)

## 2020-11-28 LAB — GLUCOSE, CAPILLARY
Glucose-Capillary: 135 mg/dL — ABNORMAL HIGH (ref 70–99)
Glucose-Capillary: 182 mg/dL — ABNORMAL HIGH (ref 70–99)
Glucose-Capillary: 198 mg/dL — ABNORMAL HIGH (ref 70–99)
Glucose-Capillary: 212 mg/dL — ABNORMAL HIGH (ref 70–99)
Glucose-Capillary: 213 mg/dL — ABNORMAL HIGH (ref 70–99)
Glucose-Capillary: 233 mg/dL — ABNORMAL HIGH (ref 70–99)
Glucose-Capillary: 238 mg/dL — ABNORMAL HIGH (ref 70–99)
Glucose-Capillary: 248 mg/dL — ABNORMAL HIGH (ref 70–99)

## 2020-11-28 LAB — ETHANOL: Alcohol, Ethyl (B): <10 mg/dL (ref <10)

## 2020-11-28 LAB — CBC
HCT: 35.3 % — ABNORMAL LOW (ref 36.0–46.0)
Hemoglobin: 12.4 g/dL (ref 12.0–15.0)
MCH: 30 pg (ref 26.0–34.0)
MCHC: 35.1 g/dL (ref 30.0–36.0)
MCV: 85.3 fL (ref 80.0–100.0)
Platelets: 276 10*3/uL (ref 150–400)
RBC: 4.14 MIL/uL (ref 3.87–5.11)
RDW: 13.2 % (ref 11.5–15.5)
WBC: 6.7 10*3/uL (ref 4.0–10.5)
nRBC: 0 % (ref 0.0–0.2)

## 2020-11-28 LAB — TROPONIN I (HIGH SENSITIVITY)
Troponin I (High Sensitivity): 7 ng/L (ref <18)
Troponin I (High Sensitivity): 6 ng/L (ref <18)

## 2020-11-28 LAB — CBG MONITORING, ED
Glucose-Capillary: >600 mg/dL (ref 70–99)
Glucose-Capillary: 494 mg/dL — ABNORMAL HIGH (ref 70–99)
Glucose-Capillary: 412 mg/dL — ABNORMAL HIGH (ref 70–99)
Glucose-Capillary: 438 mg/dL — ABNORMAL HIGH (ref 70–99)
Glucose-Capillary: 351 mg/dL — ABNORMAL HIGH (ref 70–99)
Glucose-Capillary: 302 mg/dL — ABNORMAL HIGH (ref 70–99)

## 2020-11-28 LAB — ACETAMINOPHEN LEVEL: Acetaminophen (Tylenol), Serum: <10 ug/mL — ABNORMAL LOW (ref 10–30)

## 2020-11-28 LAB — SALICYLATE LEVEL: Salicylate Lvl: <7 mg/dL — ABNORMAL LOW (ref 7.0–30.0)

## 2020-11-28 LAB — OSMOLALITY: Osmolality: 317 mOsm/kg — ABNORMAL HIGH (ref 275–295)

## 2020-11-28 LAB — MRSA PCR SCREENING: MRSA by PCR: NEGATIVE

## 2020-11-28 MED ORDER — LACTATED RINGERS IV SOLN: INTRAVENOUS | Status: DC

## 2020-11-28 MED ORDER — ACETAMINOPHEN 325 MG PO TABS
650.0000 mg | ORAL_TABLET | Freq: Four times a day (QID) | ORAL | Status: DC | PRN
Start: 2020-11-28 — End: 2020-12-02
  Administered 2020-11-29 – 2020-11-30 (×2): 650 mg via ORAL
  Filled 2020-11-28 (×2): qty 2

## 2020-11-28 MED ORDER — SODIUM CHLORIDE 0.9% FLUSH
3.0000 mL | Freq: Two times a day (BID) | INTRAVENOUS | Status: DC
  Administered 2020-11-28 – 2020-12-01 (×7): 3 mL via INTRAVENOUS

## 2020-11-28 MED ORDER — NICOTINE 21 MG/24HR TD PT24
21.0000 mg | MEDICATED_PATCH | Freq: Every day | TRANSDERMAL | Status: DC
  Administered 2020-11-28 – 2020-12-01 (×4): 21 mg via TRANSDERMAL
  Filled 2020-11-28 (×5): qty 1

## 2020-11-28 MED ORDER — SODIUM CHLORIDE 0.9 % IV BOLUS
1000.0000 mL | Freq: Once | INTRAVENOUS | Status: AC
  Administered 2020-11-28: 1000 mL via INTRAVENOUS

## 2020-11-28 MED ORDER — DEXTROSE 50 % IV SOLN: 0.0000 mL | INTRAVENOUS | Status: DC | PRN

## 2020-11-28 MED ORDER — ONDANSETRON HCL 4 MG PO TABS: 4.0000 mg | ORAL_TABLET | Freq: Four times a day (QID) | ORAL | Status: DC | PRN

## 2020-11-28 MED ORDER — ENOXAPARIN SODIUM 40 MG/0.4ML IJ SOSY
40.0000 mg | PREFILLED_SYRINGE | INTRAMUSCULAR | Status: DC
  Administered 2020-11-28 – 2020-12-01 (×4): 40 mg via SUBCUTANEOUS
  Filled 2020-11-28 (×4): qty 0.4

## 2020-11-28 MED ORDER — ENSURE ENLIVE PO LIQD: 237.0000 mL | Freq: Two times a day (BID) | ORAL | Status: DC

## 2020-11-28 MED ORDER — CHLORHEXIDINE GLUCONATE CLOTH 2 % EX PADS
6.0000 | MEDICATED_PAD | Freq: Every day | CUTANEOUS | Status: DC
  Administered 2020-11-28 – 2020-12-01 (×3): 6 via TOPICAL

## 2020-11-28 MED ORDER — ONDANSETRON HCL 4 MG/2ML IJ SOLN: 4.0000 mg | Freq: Four times a day (QID) | INTRAMUSCULAR | Status: DC | PRN

## 2020-11-28 MED ORDER — DEXTROSE IN LACTATED RINGERS 5 % IV SOLN: INTRAVENOUS | Status: DC

## 2020-11-28 MED ORDER — INSULIN REGULAR(HUMAN) IN NACL 100-0.9 UT/100ML-% IV SOLN
INTRAVENOUS | Status: DC
  Administered 2020-11-28: 4.2 [IU]/h via INTRAVENOUS
  Administered 2020-11-28: 4.4 [IU]/h via INTRAVENOUS
  Filled 2020-11-28 (×2): qty 100

## 2020-11-28 MED ORDER — ACETAMINOPHEN 650 MG RE SUPP: 650.0000 mg | Freq: Four times a day (QID) | RECTAL | Status: DC | PRN

## 2020-11-28 NOTE — ED Notes (Signed)
Insulin drip adjusted to 4.2 units per hour per Endo Tool recommendation after current pt BG of 351.

## 2020-11-28 NOTE — ED Provider Notes (Signed)
  Face-to-face evaluation   History: She presents for evaluation of headache which has been "going on a long time."  Unable to specify how long.  She denies chest pain, shortness of breath, focal weakness or paresthesia.  She states she has high blood pressure and is taking her medication.  Physical exam: Alert, tearful, uncomfortable.  No respiratory distress.  Chest nontender to palpation.  Abdomen soft and nontender.  Pupils equal round reactive to light.  Medical screening examination/treatment/procedure(s) were conducted as a shared visit with non-physician practitioner(s) and myself.  I personally evaluated the patient during the encounter   Daleen Bo, MD 11/29/20 [phone]

## 2020-11-28 NOTE — H&P (Signed)
History and Physical    Jessica Lara QMG:[id number] DOB: 1960/09/20 DOA: 11/28/2020  PCP: Biagio Borg, MD  Patient coming from: Home  Chief Complaint: passed out  HPI: Jessica Lara is a 60 y.o. female with medical history significant of HTN, HLD. Presenting with possible syncopal episode and hyperglycemia. Patient is an unwilling historian. Multiple family members were contacted and were either unwilling to talk or did not know what happened. She was apparently found down by family this morning. They called EMS. Her glucose was checked at the scene by EMS and found to be 600. She was transported to the ED for help. Per EDPA note, patient was tearful during her interview, stating "I just want to end it all." She was otherwise uncooperative with interview.    ED Course: Her glucose was found to be 809. She didn't have a high anion gap and neither was her bicarb low. She was started on insulin gtt. TRH was called for admission.   Review of Systems:  She is unwilling to answer further questions.    PMHx Past Medical History:  Diagnosis Date  . Anxiety   . ANXIETY DEPRESSION   . Arthritis   . Chicken pox   . H/O: hysterectomy   . Hypertension   . Other and unspecified hyperlipidemia   . SMOKER   . Unspecified essential hypertension     PSHx Past Surgical History:  Procedure Laterality Date  . ABDOMINAL HYSTERECTOMY  1999   fibroids  . COLONOSCOPY    . ESOPHAGOGASTRODUODENOSCOPY  01/09/2012   Procedure: ESOPHAGOGASTRODUODENOSCOPY (EGD);  Surgeon: Irene Shipper, MD;  Location: [facility] ENDOSCOPY;  Service: Endoscopy;  Laterality: N/A;  . single oopherectomy  1999    SocHx +tobacco (1ppd), quit EtOH 4 months ago, +mj use  Allergies  Allergen Reactions  . Lisinopril     cough    FamHx Family History  Problem Relation Age of Onset  . Diabetes Mother   . Hypertension Mother   . Hyperlipidemia Mother   . Stomach cancer Father   . Heart disease Sister   . Hypertension  Brother   . Diabetes Brother   . Colon cancer Neg Hx   . Esophageal cancer Neg Hx   . Rectal cancer Neg Hx   . Colon polyps Neg Hx     Prior to Admission medications   Medication Sig Start Date End Date Taking? Authorizing Provider  fenofibrate 160 MG tablet Take 1 tablet (160 mg total) by mouth daily. 04/10/19   Biagio Borg, MD  gabapentin (NEURONTIN) 100 MG capsule Take 2 capsules (200 mg total) by mouth at bedtime. 11/11/20   Derrill Center, NP  hydrOXYzine (ATARAX/VISTARIL) 25 MG tablet Take 1 tablet (25 mg total) by mouth every 6 (six) hours as needed for anxiety. 11/11/20   Derrill Center, NP  Icosapent Ethyl 1 g CAPS Take 2 capsules (2 g total) by mouth 2 (two) times daily. 06/21/18   Pixie Casino, MD  olmesartan-hydrochlorothiazide (BENICAR HCT) 40-25 MG tablet TAKE 1 TABLET BY MOUTH EVERY DAY 04/23/20   Biagio Borg, MD  potassium chloride (KLOR-CON) 10 MEQ tablet Take 1 tablet (10 mEq total) by mouth daily. 04/10/19   Biagio Borg, MD  venlafaxine (EFFEXOR) 37.5 MG tablet Take 1 tablet (37.5 mg total) by mouth 2 (two) times daily with a meal. 11/11/20   Derrill Center, NP  venlafaxine XR (EFFEXOR-XR) 150 MG 24 hr capsule TAKE 1 CAPSULE BY MOUTH DAILY  WITH BREAKFAST. 07/03/20   Biagio Borg, MD  Vitamin D, Ergocalciferol, (DRISDOL) 1.25 MG (50000 UT) CAPS capsule Take 1 capsule (50,000 Units total) by mouth every 7 (seven) days. 04/10/19   Biagio Borg, MD    Physical Exam: Vitals:   11/28/20 1115 11/28/20 1130 11/28/20 1235 11/28/20 1315  BP: (!) 151/80 (!) 157/84 127/63 (!) 160/68  Pulse: 92  65 (!) 58  Resp: (!) 27 17 15 19   Temp:      SpO2: 100%  98% 99%    General: 60 y.o. female resting in bed in NAD Eyes: PERRL, normal sclera ENMT: Nares patent w/o discharge, orophaynx clear, dentition normal, ears w/o discharge/lesions/ulcers Neck: Supple, trachea midline Cardiovascular: RRR, +S1, S2, no m/g/r, equal pulses throughout Respiratory: CTABL, no w/r/r, normal  WOB GI: BS+, NDNT, no masses noted, no organomegaly noted MSK: No e/c/c Skin: No rashes, bruises, ulcerations noted Neuro: A&O x 3, no focal deficits Psyc: Somewhat agitated, tearful  Labs on Admission: I have personally reviewed following labs and imaging studies  CBC: Recent Labs  Lab 11/28/20 1040  WBC 6.7  HGB 12.4  HCT 35.3*  MCV 85.3  PLT 433   Basic Metabolic Panel: Recent Labs  Lab 11/28/20 1040  NA 126*  K 4.4  CL 88*  CO2 26  GLUCOSE 809*  BUN 19  CREATININE 1.08*  CALCIUM 10.0   GFR: CrCl cannot be calculated (Unknown ideal weight.). Liver Function Tests: Recent Labs  Lab 11/28/20 1108  AST 20  ALT 20  ALKPHOS 79  BILITOT 0.3  PROT 8.3*  ALBUMIN 4.2   No results for input(s): LIPASE, AMYLASE in the last 168 hours. No results for input(s): AMMONIA in the last 168 hours. Coagulation Profile: No results for input(s): INR, PROTIME in the last 168 hours. Cardiac Enzymes: No results for input(s): CKTOTAL, CKMB, CKMBINDEX, TROPONINI in the last 168 hours. BNP (last 3 results) No results for input(s): PROBNP in the last 8760 hours. HbA1C: No results for input(s): HGBA1C in the last 72 hours. CBG: Recent Labs  Lab 11/28/20 1029 11/28/20 1259 11/28/20 1339  GLUCAP >600* 494* 438*   Lipid Profile: No results for input(s): CHOL, HDL, LDLCALC, TRIG, CHOLHDL, LDLDIRECT in the last 72 hours. Thyroid Function Tests: No results for input(s): TSH, T4TOTAL, FREET4, T3FREE, THYROIDAB in the last 72 hours. Anemia Panel: No results for input(s): VITAMINB12, FOLATE, FERRITIN, TIBC, IRON, RETICCTPCT in the last 72 hours. Urine analysis:    Component Value Date/Time   COLORURINE COLORLESS (A) 11/28/2020 1040   APPEARANCEUR CLEAR 11/28/2020 1040   LABSPEC 1.018 11/28/2020 1040   PHURINE 7.0 11/28/2020 1040   GLUCOSEU >=500 (A) 11/28/2020 1040   GLUCOSEU NEGATIVE 04/10/2019 1549   HGBUR NEGATIVE 11/28/2020 1040   BILIRUBINUR NEGATIVE 11/28/2020 1040    KETONESUR NEGATIVE 11/28/2020 1040   PROTEINUR NEGATIVE 11/28/2020 1040   UROBILINOGEN 0.2 04/10/2019 1549   NITRITE NEGATIVE 11/28/2020 1040   [place] 11/28/2020 1040    Radiological Exams on Admission: CT Head Wo Contrast  Result Date: 11/28/2020 CLINICAL DATA:  Neck trauma, intoxicated or up tended. Syncope, simple, normal neuro exam. EXAM: CT HEAD WITHOUT CONTRAST CT CERVICAL SPINE WITHOUT CONTRAST TECHNIQUE: Multidetector CT imaging of the head and cervical spine was performed following the standard protocol without intravenous contrast. Multiplanar CT image reconstructions of the cervical spine were also generated. COMPARISON:  No pertinent prior exams available for comparison. FINDINGS: CT HEAD FINDINGS Brain: Cerebral volume is normal. There is no acute  intracranial hemorrhage. No demarcated cortical infarct. No extra-axial fluid collection. No evidence of intracranial mass. No midline shift. Vascular: No hyperdense vessel.  Atherosclerotic calcifications. Skull: Normal. Negative for fracture or focal lesion. Sinuses/Orbits: Visualized orbits show no acute finding. Trace bilateral ethmoid and right maxillary sinus mucosal thickening at the imaged levels. CT CERVICAL SPINE FINDINGS Alignment: Cervical levocurvature. Straightening of the expected cervical lordosis. No significant spondylolisthesis. Skull base and vertebrae: The basion-dental and atlanto-dental intervals are maintained.No evidence of acute fracture to the cervical spine. Soft tissues and spinal canal: No prevertebral fluid or swelling. No visible canal hematoma. Disc levels: Cervical spondylosis. Most notably at C5-C6, there is moderate disc degeneration, a disc bulge, a superimposed central disc protrusion, endplate spurring and uncovertebral hypertrophy. Bilateral neural foraminal narrowing and apparent moderate spinal canal stenosis at this level. Upper chest: No consolidation within the imaged lung apices. No visible  pneumothorax. IMPRESSION: CT head: 1. Unremarkable non-contrast CT appearance of the brain. No evidence of acute intracranial abnormality. 2. Mild paranasal sinus mucosal thickening. CT cervical spine: 1. No evidence of acute fracture to the cervical spine. 2. Nonspecific straightening of the expected cervical lordosis. 3. Cervical levocurvature. 4. Cervical spondylosis, as described and greatest at C5-C6. Electronically Signed   By: Kellie Simmering DO   On: 11/28/2020 12:29   CT Cervical Spine Wo Contrast  Result Date: 11/28/2020 CLINICAL DATA:  Neck trauma, intoxicated or up tended. Syncope, simple, normal neuro exam. EXAM: CT HEAD WITHOUT CONTRAST CT CERVICAL SPINE WITHOUT CONTRAST TECHNIQUE: Multidetector CT imaging of the head and cervical spine was performed following the standard protocol without intravenous contrast. Multiplanar CT image reconstructions of the cervical spine were also generated. COMPARISON:  No pertinent prior exams available for comparison. FINDINGS: CT HEAD FINDINGS Brain: Cerebral volume is normal. There is no acute intracranial hemorrhage. No demarcated cortical infarct. No extra-axial fluid collection. No evidence of intracranial mass. No midline shift. Vascular: No hyperdense vessel.  Atherosclerotic calcifications. Skull: Normal. Negative for fracture or focal lesion. Sinuses/Orbits: Visualized orbits show no acute finding. Trace bilateral ethmoid and right maxillary sinus mucosal thickening at the imaged levels. CT CERVICAL SPINE FINDINGS Alignment: Cervical levocurvature. Straightening of the expected cervical lordosis. No significant spondylolisthesis. Skull base and vertebrae: The basion-dental and atlanto-dental intervals are maintained.No evidence of acute fracture to the cervical spine. Soft tissues and spinal canal: No prevertebral fluid or swelling. No visible canal hematoma. Disc levels: Cervical spondylosis. Most notably at C5-C6, there is moderate disc degeneration, a  disc bulge, a superimposed central disc protrusion, endplate spurring and uncovertebral hypertrophy. Bilateral neural foraminal narrowing and apparent moderate spinal canal stenosis at this level. Upper chest: No consolidation within the imaged lung apices. No visible pneumothorax. IMPRESSION: CT head: 1. Unremarkable non-contrast CT appearance of the brain. No evidence of acute intracranial abnormality. 2. Mild paranasal sinus mucosal thickening. CT cervical spine: 1. No evidence of acute fracture to the cervical spine. 2. Nonspecific straightening of the expected cervical lordosis. 3. Cervical levocurvature. 4. Cervical spondylosis, as described and greatest at C5-C6. Electronically Signed   By: Kellie Simmering DO   On: 11/28/2020 12:29   DG Chest Port 1 View  Result Date: 11/28/2020 CLINICAL DATA:  Abnormal EKG EXAM: PORTABLE CHEST 1 VIEW COMPARISON:  09/01/2012 FINDINGS: Artifact from EKG leads. Normal heart size and mediastinal contours. No acute infiltrate or edema. No effusion or pneumothorax. No acute osseous findings. IMPRESSION: Negative chest. Electronically Signed   By: Monte Fantasia M.D.   On: 11/28/2020 11:49  EKG: Independently reviewed. Sinus, repol abnormality  Assessment/Plan Hyperglycemia DM, ?new diagnosis?     - admit to inpt, SDU     - continue insulin gtt, check A1c     - NPO except meds, non-caloric fluids     - DM coordination consult  Possible syncope     - found down on floor this morning     - she is not endorsing chest pain now but only says she's tired and has been tired when asked about events leading up to her visit     - trp neg x 2, EKG sinus w/ some repol abnormalities     - EtOH neg, salicylates neg, LFTs neg     - no respiratory distress, no tachycardia     - neg CTH, CT c-spine, CXR     - check echo, orthostatics, and UDS to complete syncopal work up  Depression     - continue home meds when confirmed by pharm     - consider psyc  consult  Hyponatremia     - as a fxn of above (pseudo hypoNa+); corrected Na+ is ~140  HLD     - resume home meds when confirmed  HTN     - resume home meds when confirmed  DVT prophylaxis: lovenox  Code Status: FULL  Family Communication: spoke with Lutricia Horsfall, Cherie Dark, and Lynett Grimes  Consults called: None   Status is: Inpatient  Remains inpatient appropriate because:Inpatient level of care appropriate due to severity of illness   Dispo: The patient is from: Home              Anticipated d/c is to: Home              Patient currently is not medically stable to d/c.   Difficult to place patient No  Jonnie Finner DO [facility]  If 7PM-7AM, please contact night-coverage [email or web address]  11/28/2020, 1:50 PM

## 2020-11-28 NOTE — ED Triage Notes (Signed)
Per EMS, states patient was talking to family and suddenly became nonverbal and fell to the floor-states CBG read high, patient is not a diabetic-history of behavioral issues

## 2020-11-28 NOTE — ED Notes (Signed)
Xray at the bedside.

## 2020-11-28 NOTE — ED Notes (Signed)
Hospitalist at the bedside 

## 2020-11-28 NOTE — Plan of Care (Signed)
  Problem: Education: Goal: Knowledge of General Education information will improve Description: Including pain rating scale, medication(s)/side effects and non-pharmacologic comfort measures Outcome: Progressing   Problem: Health Behavior/Discharge Planning: Goal: Ability to manage health-related needs will improve Outcome: Progressing   Problem: Clinical Measurements: Goal: Ability to maintain clinical measurements within normal limits will improve Outcome: Progressing Goal: Will remain free from infection Outcome: Progressing Goal: Diagnostic test results will improve Outcome: Progressing Goal: Respiratory complications will improve Outcome: Progressing Goal: Cardiovascular complication will be avoided Outcome: Progressing   Problem: Activity: Goal: Risk for activity intolerance will decrease Outcome: Progressing   Problem: Nutrition: Goal: Adequate nutrition will be maintained Outcome: Progressing   Problem: Coping: Goal: Level of anxiety will decrease Outcome: Progressing   Problem: Elimination: Goal: Will not experience complications related to bowel motility Outcome: Progressing Goal: Will not experience complications related to urinary retention Outcome: Progressing   Problem: Pain Managment: Goal: General experience of comfort will improve Outcome: Progressing   Problem: Safety: Goal: Ability to remain free from injury will improve Outcome: Progressing   Problem: Skin Integrity: Goal: Risk for impaired skin integrity will decrease Outcome: Progressing   Problem: Education: Goal: Knowledge of warning signs, risks, and behaviors that relate to suicide ideation and self-harm behaviors will improve Outcome: Progressing   Problem: Health Behavior/Discharge (Transition) Planning: Goal: Ability to manage health-related needs will improve Outcome: Progressing   Problem: Clinical Measurements: Goal: Remain free from any harm during hospitalization Outcome:  Progressing   Problem: Nutrition: Goal: Adequate fluids and nutrition will be maintained Outcome: Progressing   Problem: Coping: Goal: Ability to disclose and discuss thoughts of suicide and self-harm will improve Outcome: Progressing   Problem: Medication Management: Goal: Adhere to prescribed medication regimen Outcome: Progressing   Problem: Sleep Hygiene: Goal: Ability to obtain adequate restful sleep will improve Outcome: Progressing   Problem: Self Esteem: Goal: Ability to verbalize positive feeling about self will improve Outcome: Progressing

## 2020-11-28 NOTE — ED Notes (Signed)
This nurse obtained EKG x2, both of which read acute MI. Dr. Eulis Foster notified as well as Suella Broad, PA-C. No further orders at this time from Dr. Eulis Foster of Suella Broad PA-C. Pt on cardiac monitor x3. Lab work obtained and sent. Will continue to monitor.

## 2020-11-28 NOTE — ED Notes (Signed)
Jessica Lara, said she would like her sister to get a psych evaluation. [phone].

## 2020-11-28 NOTE — ED Notes (Signed)
Pt A&O x4 and tearful at the bedside. Attached to cardiac monitor x3. Pt with c-collar in place. Pt states "I don't know" when asked what happened prior to her arrival to the hospital. Pt reports a known hx of HTN and states "I don't know" when asked if she has a known hx of diabetes. Pt denies any pain at this time. Pt states, "I don't know" when asked if she took her BP medication today.

## 2020-11-28 NOTE — ED Provider Notes (Signed)
[facility] DEPT Provider Note   CSN: [id number] Arrival date & time: 11/28/20  1014     History Chief Complaint  Patient presents with  . Hyperglycemia    Jessica Lara is a 60 y.o. female.  60 year old female brought in by EMS for report of syncopal episode, eased to the floor by family and did not fall found to have hyperglycemia with blood sugar greater than 600.  Patient arrives with a c-collar in place, at time of exam she is tearful and states "I just want to end it all."  Patient is a complicated historian as she does not want to answer questions at this time, level 5 caveat applies.  Patient agrees that she has been more thirsty than usual recently, states that she did not take anything to harm herself today.  Otherwise would not answer any of my questions.        Past Medical History:  Diagnosis Date  . Anxiety   . ANXIETY DEPRESSION   . Arthritis   . Chicken pox   . H/O: hysterectomy   . Hypertension   . Other and unspecified hyperlipidemia   . SMOKER   . Unspecified essential hypertension     Patient Active Problem List   Diagnosis Date Noted  . Insomnia 09/26/2017  . Cervical radiculopathy at C7 07/13/2017  . Sleep disturbance 08/06/2015  . Essential hypertension 08/06/2015  . Routine health maintenance 08/24/2013  . Duodenitis without mention of hemorrhage 01/09/2012  . Hyperglycemia 05/15/2011  . ANXIETY DEPRESSION 07/08/2010  . Hypertriglyceridemia 06/16/2010  . SMOKER 06/16/2010    Past Surgical History:  Procedure Laterality Date  . ABDOMINAL HYSTERECTOMY  1999   fibroids  . COLONOSCOPY    . ESOPHAGOGASTRODUODENOSCOPY  01/09/2012   Procedure: ESOPHAGOGASTRODUODENOSCOPY (EGD);  Surgeon: Irene Shipper, MD;  Location: [facility] ENDOSCOPY;  Service: Endoscopy;  Laterality: N/A;  . single oopherectomy  1999     OB History   No obstetric history on file.     Family History  Problem Relation Age of Onset  . Diabetes  Mother   . Hypertension Mother   . Hyperlipidemia Mother   . Stomach cancer Father   . Heart disease Sister   . Hypertension Brother   . Diabetes Brother   . Colon cancer Neg Hx   . Esophageal cancer Neg Hx   . Rectal cancer Neg Hx   . Colon polyps Neg Hx     Social History   Tobacco Use  . Smoking status: Current Every Day Smoker    Packs/day: 1.00    Years: 33.00    Pack years: 33.00    Types: Cigarettes  . Smokeless tobacco: Never Used  Vaping Use  . Vaping Use: Never used  Substance Use Topics  . Alcohol use: Yes    Alcohol/week: 2.0 standard drinks    Types: 2 Cans of beer per week  . Drug use: No    Home Medications Prior to Admission medications   Medication Sig Start Date End Date Taking? Authorizing Provider  fenofibrate 160 MG tablet Take 1 tablet (160 mg total) by mouth daily. 04/10/19   Biagio Borg, MD  gabapentin (NEURONTIN) 100 MG capsule Take 2 capsules (200 mg total) by mouth at bedtime. 11/11/20   Derrill Center, NP  hydrOXYzine (ATARAX/VISTARIL) 25 MG tablet Take 1 tablet (25 mg total) by mouth every 6 (six) hours as needed for anxiety. 11/11/20   Derrill Center, NP  Icosapent Ethyl  1 g CAPS Take 2 capsules (2 g total) by mouth 2 (two) times daily. 06/21/18   Pixie Casino, MD  olmesartan-hydrochlorothiazide (BENICAR HCT) 40-25 MG tablet TAKE 1 TABLET BY MOUTH EVERY DAY 04/23/20   Biagio Borg, MD  potassium chloride (KLOR-CON) 10 MEQ tablet Take 1 tablet (10 mEq total) by mouth daily. 04/10/19   Biagio Borg, MD  venlafaxine (EFFEXOR) 37.5 MG tablet Take 1 tablet (37.5 mg total) by mouth 2 (two) times daily with a meal. 11/11/20   Derrill Center, NP  venlafaxine XR (EFFEXOR-XR) 150 MG 24 hr capsule TAKE 1 CAPSULE BY MOUTH DAILY WITH BREAKFAST. 07/03/20   Biagio Borg, MD  Vitamin D, Ergocalciferol, (DRISDOL) 1.25 MG (50000 UT) CAPS capsule Take 1 capsule (50,000 Units total) by mouth every 7 (seven) days. 04/10/19   Biagio Borg, MD    Allergies     Lisinopril  Review of Systems   Review of Systems  Unable to perform ROS: Psychiatric disorder    Physical Exam Updated Vital Signs BP (!) 160/68   Pulse (!) 58   Temp 98 F (36.7 C)   Resp 19   SpO2 99%   Physical Exam Vitals and nursing note reviewed.  Constitutional:      General: She is not in acute distress.    Appearance: She is well-developed. She is not diaphoretic.  HENT:     Head: Normocephalic and atraumatic.  Eyes:     Extraocular Movements: Extraocular movements intact.     Pupils: Pupils are equal, round, and reactive to light.  Neck:     Comments: C-collar in place, no crepitus, no step-off Cardiovascular:     Rate and Rhythm: Normal rate and regular rhythm.     Pulses: Normal pulses.     Heart sounds: Normal heart sounds.  Pulmonary:     Effort: Pulmonary effort is normal.     Breath sounds: Normal breath sounds.  Chest:     Chest wall: No tenderness.  Abdominal:     Palpations: Abdomen is soft.     Tenderness: There is no abdominal tenderness.  Musculoskeletal:        General: No swelling, tenderness or deformity.     Right lower leg: No edema.     Left lower leg: No edema.  Skin:    General: Skin is warm and dry.     Findings: No erythema or rash.  Neurological:     General: No focal deficit present.     Mental Status: She is alert.  Psychiatric:        Mood and Affect: Affect is tearful.     ED Results / Procedures / Treatments   Labs (all labs ordered are listed, but only abnormal results are displayed) Labs Reviewed  BASIC METABOLIC PANEL - Abnormal; Notable for the following components:      Result Value   Sodium 126 (*)    Chloride 88 (*)    Glucose, Bld 809 (*)    Creatinine, Ser 1.08 (*)    GFR, Estimated 59 (*)    All other components within normal limits  CBC - Abnormal; Notable for the following components:   HCT 35.3 (*)    All other components within normal limits  URINALYSIS, ROUTINE W REFLEX MICROSCOPIC -  Abnormal; Notable for the following components:   Color, Urine COLORLESS (*)    Glucose, UA >=500 (*)    All other components within normal limits  ACETAMINOPHEN LEVEL -  Abnormal; Notable for the following components:   Acetaminophen (Tylenol), Serum <10 (*)    All other components within normal limits  SALICYLATE LEVEL - Abnormal; Notable for the following components:   Salicylate Lvl <6.7 (*)    All other components within normal limits  HEPATIC FUNCTION PANEL - Abnormal; Notable for the following components:   Total Protein 8.3 (*)    All other components within normal limits  CBG MONITORING, ED - Abnormal; Notable for the following components:   Glucose-Capillary >600 (*)    All other components within normal limits  CBG MONITORING, ED - Abnormal; Notable for the following components:   Glucose-Capillary 494 (*)    All other components within normal limits  CBG MONITORING, ED - Abnormal; Notable for the following components:   Glucose-Capillary 438 (*)    All other components within normal limits  RESP PANEL BY RT-PCR (FLU A&B, COVID) ARPGX2  ETHANOL  TROPONIN I (HIGH SENSITIVITY)  TROPONIN I (HIGH SENSITIVITY)    EKG EKG Interpretation  Date/Time:  Thursday Nov 28 2020 10:54:40 EDT Ventricular Rate:  88 PR Interval:  146 QRS Duration: 93 QT Interval:  381 QTC Calculation: 461 R Axis:   63 Text Interpretation: Sinus rhythm Since last tracing of earlier today No significant change was found Confirmed by Daleen Bo [phone]) on 11/28/2020 10:58:54 AM   Radiology CT Head Wo Contrast  Result Date: 11/28/2020 CLINICAL DATA:  Neck trauma, intoxicated or up tended. Syncope, simple, normal neuro exam. EXAM: CT HEAD WITHOUT CONTRAST CT CERVICAL SPINE WITHOUT CONTRAST TECHNIQUE: Multidetector CT imaging of the head and cervical spine was performed following the standard protocol without intravenous contrast. Multiplanar CT image reconstructions of the cervical spine were also  generated. COMPARISON:  No pertinent prior exams available for comparison. FINDINGS: CT HEAD FINDINGS Brain: Cerebral volume is normal. There is no acute intracranial hemorrhage. No demarcated cortical infarct. No extra-axial fluid collection. No evidence of intracranial mass. No midline shift. Vascular: No hyperdense vessel.  Atherosclerotic calcifications. Skull: Normal. Negative for fracture or focal lesion. Sinuses/Orbits: Visualized orbits show no acute finding. Trace bilateral ethmoid and right maxillary sinus mucosal thickening at the imaged levels. CT CERVICAL SPINE FINDINGS Alignment: Cervical levocurvature. Straightening of the expected cervical lordosis. No significant spondylolisthesis. Skull base and vertebrae: The basion-dental and atlanto-dental intervals are maintained.No evidence of acute fracture to the cervical spine. Soft tissues and spinal canal: No prevertebral fluid or swelling. No visible canal hematoma. Disc levels: Cervical spondylosis. Most notably at C5-C6, there is moderate disc degeneration, a disc bulge, a superimposed central disc protrusion, endplate spurring and uncovertebral hypertrophy. Bilateral neural foraminal narrowing and apparent moderate spinal canal stenosis at this level. Upper chest: No consolidation within the imaged lung apices. No visible pneumothorax. IMPRESSION: CT head: 1. Unremarkable non-contrast CT appearance of the brain. No evidence of acute intracranial abnormality. 2. Mild paranasal sinus mucosal thickening. CT cervical spine: 1. No evidence of acute fracture to the cervical spine. 2. Nonspecific straightening of the expected cervical lordosis. 3. Cervical levocurvature. 4. Cervical spondylosis, as described and greatest at C5-C6. Electronically Signed   By: Kellie Simmering DO   On: 11/28/2020 12:29   CT Cervical Spine Wo Contrast  Result Date: 11/28/2020 CLINICAL DATA:  Neck trauma, intoxicated or up tended. Syncope, simple, normal neuro exam. EXAM: CT  HEAD WITHOUT CONTRAST CT CERVICAL SPINE WITHOUT CONTRAST TECHNIQUE: Multidetector CT imaging of the head and cervical spine was performed following the standard protocol without intravenous contrast. Multiplanar CT image  reconstructions of the cervical spine were also generated. COMPARISON:  No pertinent prior exams available for comparison. FINDINGS: CT HEAD FINDINGS Brain: Cerebral volume is normal. There is no acute intracranial hemorrhage. No demarcated cortical infarct. No extra-axial fluid collection. No evidence of intracranial mass. No midline shift. Vascular: No hyperdense vessel.  Atherosclerotic calcifications. Skull: Normal. Negative for fracture or focal lesion. Sinuses/Orbits: Visualized orbits show no acute finding. Trace bilateral ethmoid and right maxillary sinus mucosal thickening at the imaged levels. CT CERVICAL SPINE FINDINGS Alignment: Cervical levocurvature. Straightening of the expected cervical lordosis. No significant spondylolisthesis. Skull base and vertebrae: The basion-dental and atlanto-dental intervals are maintained.No evidence of acute fracture to the cervical spine. Soft tissues and spinal canal: No prevertebral fluid or swelling. No visible canal hematoma. Disc levels: Cervical spondylosis. Most notably at C5-C6, there is moderate disc degeneration, a disc bulge, a superimposed central disc protrusion, endplate spurring and uncovertebral hypertrophy. Bilateral neural foraminal narrowing and apparent moderate spinal canal stenosis at this level. Upper chest: No consolidation within the imaged lung apices. No visible pneumothorax. IMPRESSION: CT head: 1. Unremarkable non-contrast CT appearance of the brain. No evidence of acute intracranial abnormality. 2. Mild paranasal sinus mucosal thickening. CT cervical spine: 1. No evidence of acute fracture to the cervical spine. 2. Nonspecific straightening of the expected cervical lordosis. 3. Cervical levocurvature. 4. Cervical  spondylosis, as described and greatest at C5-C6. Electronically Signed   By: Kellie Simmering DO   On: 11/28/2020 12:29   DG Chest Port 1 View  Result Date: 11/28/2020 CLINICAL DATA:  Abnormal EKG EXAM: PORTABLE CHEST 1 VIEW COMPARISON:  09/01/2012 FINDINGS: Artifact from EKG leads. Normal heart size and mediastinal contours. No acute infiltrate or edema. No effusion or pneumothorax. No acute osseous findings. IMPRESSION: Negative chest. Electronically Signed   By: Monte Fantasia M.D.   On: 11/28/2020 11:49    Procedures .Critical Care Performed by: Tacy Learn, PA-C Authorized by: Tacy Learn, PA-C   Critical care provider statement:    Critical care time (minutes):  45   Critical care was time spent personally by me on the following activities:  Discussions with consultants, evaluation of patient's response to treatment, examination of patient, ordering and performing treatments and interventions, ordering and review of laboratory studies, ordering and review of radiographic studies, pulse oximetry, re-evaluation of patient's condition, obtaining history from patient or surrogate and review of old charts     Medications Ordered in ED Medications  insulin regular, human (MYXREDLIN) 100 units/ 100 mL infusion (4.2 Units/hr Intravenous New Bag/Given 11/28/20 1304)  lactated ringers infusion ( Intravenous New Bag/Given 11/28/20 1300)  dextrose 5 % in lactated ringers infusion (has no administration in time range)  dextrose 50 % solution 0-50 mL (has no administration in time range)  sodium chloride 0.9 % bolus 1,000 mL (1,000 mLs Intravenous New Bag/Given 11/28/20 1126)    ED Course  I have reviewed the triage vital signs and the nursing notes.  Pertinent labs & imaging results that were available during my care of the patient were reviewed by me and considered in my medical decision making (see chart for details).  Clinical Course as of 11/28/20 1341  Thu Nov 29, 8270  7147  60 year old female brought in by EMS after questionable syncopal episode today and hyperglycemia.  No formal diagnosis of diabetes however hemoglobin A1c from 04/2019 was 7.4.  Patient is tearful at time of exam, answer some questions otherwise states that she just wants it all to  end. Patient was seen by Dr. Eulis Foster, ER attending, was more communicative with him and denied having any chest pain. EKG found to have ST changes however negative for chest pain and troponins are flat at 7 and 6. Patient's CBG was greater than 600, glucose on BMP 809 with normal bicarb and anion gap.  Potassium was 4.4.  Patient was started on insulin drip per glucose manager and has improved to 494.  CBC is unremarkable, urinalysis positive for glucose, no ketones. Aspirin and Tylenol levels as well as alcohol obtained due to reports of wanting to "end it all."  These tests are negative. CT head and C-spine due to report of questionable syncopal episode today are unremarkable.  Chest x-ray unremarkable.  Plan is to admit to the hospital for further management of her hyperglycemia.  Discussed this with patient who is agreeable to stay in the hospital, she continues to refuse to answer questions and states that she will only talk to her cousin.  I attempted to call her cousin's place of employment for her without success. [LM]  1339 Case discussed with Dr. Marylyn Ishihara with [facility] hospitalist service who will consult for admission. [LM]    Clinical Course User Index [LM] Roque Lias   MDM Rules/Calculators/A&P                          Final Clinical Impression(s) / ED Diagnoses Final diagnoses:  Hyperglycemia  Syncope, unspecified syncope type    Rx / DC Orders ED Discharge Orders    None       Tacy Learn, PA-C 11/28/20 1341    Daleen Bo, MD 11/29/20 [phone]

## 2020-11-28 NOTE — ED Notes (Signed)
Pharmacy notified and aware of insulin drip.

## 2020-11-28 NOTE — ED Notes (Signed)
ED provider, Dr. Eulis Foster at the bedside to evaluate at this time.

## 2020-11-28 NOTE — ED Notes (Signed)
PA-C at the bedside to evaluate.  

## 2020-11-28 NOTE — ED Notes (Signed)
Pt's Insulin drip adjusted to 4.4 units/hr, per Endo Tool recommendation from blood sugar of 302.

## 2020-11-29 ENCOUNTER — Other Ambulatory Visit (HOSPITAL_COMMUNITY): Payer: Self-pay

## 2020-11-29 LAB — BASIC METABOLIC PANEL
Anion gap: 8 (ref 5–15)
Anion gap: 10 (ref 5–15)
Anion gap: 12 (ref 5–15)
BUN: 10 mg/dL (ref 6–20)
BUN: 8 mg/dL (ref 6–20)
BUN: 13 mg/dL (ref 6–20)
CO2: 26 mmol/L (ref 22–32)
CO2: 21 mmol/L — ABNORMAL LOW (ref 22–32)
CO2: 22 mmol/L (ref 22–32)
Calcium: 9.6 mg/dL (ref 8.9–10.3)
Calcium: 9.6 mg/dL (ref 8.9–10.3)
Calcium: 9.6 mg/dL (ref 8.9–10.3)
Chloride: 104 mmol/L (ref 98–111)
Chloride: 104 mmol/L (ref 98–111)
Chloride: 100 mmol/L (ref 98–111)
Creatinine, Ser: 0.66 mg/dL (ref 0.44–1.00)
Creatinine, Ser: 0.79 mg/dL (ref 0.44–1.00)
Creatinine, Ser: 1 mg/dL (ref 0.44–1.00)
GFR, Estimated: >60 mL/min (ref >60)
GFR, Estimated: >60 mL/min (ref >60)
GFR, Estimated: >60 mL/min (ref >60)
Glucose, Bld: 156 mg/dL — ABNORMAL HIGH (ref 70–99)
Glucose, Bld: 193 mg/dL — ABNORMAL HIGH (ref 70–99)
Glucose, Bld: 311 mg/dL — ABNORMAL HIGH (ref 70–99)
Potassium: 2.9 mmol/L — ABNORMAL LOW (ref 3.5–5.1)
Potassium: 4.2 mmol/L (ref 3.5–5.1)
Potassium: 4.1 mmol/L (ref 3.5–5.1)
Sodium: 138 mmol/L (ref 135–145)
Sodium: 135 mmol/L (ref 135–145)
Sodium: 134 mmol/L — ABNORMAL LOW (ref 135–145)

## 2020-11-29 LAB — GLUCOSE, CAPILLARY
Glucose-Capillary: 142 mg/dL — ABNORMAL HIGH (ref 70–99)
Glucose-Capillary: 158 mg/dL — ABNORMAL HIGH (ref 70–99)
Glucose-Capillary: 160 mg/dL — ABNORMAL HIGH (ref 70–99)
Glucose-Capillary: 163 mg/dL — ABNORMAL HIGH (ref 70–99)
Glucose-Capillary: 201 mg/dL — ABNORMAL HIGH (ref 70–99)
Glucose-Capillary: 330 mg/dL — ABNORMAL HIGH (ref 70–99)
Glucose-Capillary: 301 mg/dL — ABNORMAL HIGH (ref 70–99)
Glucose-Capillary: 320 mg/dL — ABNORMAL HIGH (ref 70–99)
Glucose-Capillary: 292 mg/dL — ABNORMAL HIGH (ref 70–99)

## 2020-11-29 LAB — CBC
HCT: 32.9 % — ABNORMAL LOW (ref 36.0–46.0)
Hemoglobin: 11.2 g/dL — ABNORMAL LOW (ref 12.0–15.0)
MCH: 29.6 pg (ref 26.0–34.0)
MCHC: 34 g/dL (ref 30.0–36.0)
MCV: 87 fL (ref 80.0–100.0)
Platelets: 244 10*3/uL (ref 150–400)
RBC: 3.78 MIL/uL — ABNORMAL LOW (ref 3.87–5.11)
RDW: 13.6 % (ref 11.5–15.5)
WBC: 6.7 10*3/uL (ref 4.0–10.5)
nRBC: 0 % (ref 0.0–0.2)

## 2020-11-29 LAB — HEMOGLOBIN A1C
Hgb A1c MFr Bld: >15.5 % — ABNORMAL HIGH (ref 4.8–5.6)
Mean Plasma Glucose: >398 mg/dL

## 2020-11-29 LAB — HIV ANTIBODY (ROUTINE TESTING W REFLEX): HIV Screen 4th Generation wRfx: NONREACTIVE

## 2020-11-29 LAB — MAGNESIUM: Magnesium: 2 mg/dL (ref 1.7–2.4)

## 2020-11-29 MED ORDER — LIVING WELL WITH DIABETES BOOK
Freq: Once | Status: AC
  Filled 2020-11-29: qty 1

## 2020-11-29 MED ORDER — MELATONIN 3 MG PO TABS
3.0000 mg | ORAL_TABLET | Freq: Once | ORAL | Status: AC
  Administered 2020-11-29: 3 mg via ORAL
  Filled 2020-11-29: qty 1

## 2020-11-29 MED ORDER — POTASSIUM CHLORIDE 20 MEQ PO PACK
40.0000 meq | PACK | Freq: Once | ORAL | Status: AC
  Administered 2020-11-29: 40 meq via ORAL
  Filled 2020-11-29: qty 2

## 2020-11-29 MED ORDER — LACTATED RINGERS IV SOLN: INTRAVENOUS | Status: DC

## 2020-11-29 MED ORDER — INSULIN ASPART 100 UNIT/ML IJ SOLN
0.0000 [IU] | Freq: Three times a day (TID) | INTRAMUSCULAR | Status: DC
  Administered 2020-11-29: 15 [IU] via SUBCUTANEOUS
  Administered 2020-11-29: 7 [IU] via SUBCUTANEOUS
  Administered 2020-11-29: 15 [IU] via SUBCUTANEOUS
  Administered 2020-11-30: 11 [IU] via SUBCUTANEOUS
  Administered 2020-11-30: 15 [IU] via SUBCUTANEOUS
  Administered 2020-11-30: 4 [IU] via SUBCUTANEOUS
  Administered 2020-12-01: 11 [IU] via SUBCUTANEOUS
  Administered 2020-12-01: 7 [IU] via SUBCUTANEOUS
  Administered 2020-12-02: 4 [IU] via SUBCUTANEOUS

## 2020-11-29 MED ORDER — INSULIN ASPART 100 UNIT/ML IJ SOLN
0.0000 [IU] | Freq: Every day | INTRAMUSCULAR | Status: DC
  Administered 2020-11-29: 3 [IU] via SUBCUTANEOUS
  Administered 2020-11-30: 2 [IU] via SUBCUTANEOUS

## 2020-11-29 MED ORDER — INSULIN GLARGINE 100 UNIT/ML ~~LOC~~ SOLN
5.0000 [IU] | Freq: Every day | SUBCUTANEOUS | Status: DC
  Filled 2020-11-29: qty 0.05

## 2020-11-29 MED ORDER — POTASSIUM CHLORIDE 10 MEQ/100ML IV SOLN
10.0000 meq | INTRAVENOUS | Status: DC
  Administered 2020-11-29: 10 meq via INTRAVENOUS
  Filled 2020-11-29: qty 100

## 2020-11-29 MED ORDER — INSULIN STARTER KIT- PEN NEEDLES (ENGLISH)
1.0000 | Freq: Once | Status: AC
  Administered 2020-11-29: 1
  Filled 2020-11-29: qty 1

## 2020-11-29 MED ORDER — INSULIN ASPART 100 UNIT/ML IJ SOLN
4.0000 [IU] | Freq: Three times a day (TID) | INTRAMUSCULAR | Status: DC
  Administered 2020-11-29 – 2020-11-30 (×3): 4 [IU] via SUBCUTANEOUS

## 2020-11-29 MED ORDER — INSULIN GLARGINE 100 UNIT/ML ~~LOC~~ SOLN
5.0000 [IU] | Freq: Two times a day (BID) | SUBCUTANEOUS | Status: DC
  Administered 2020-11-29: 5 [IU] via SUBCUTANEOUS
  Filled 2020-11-29: qty 0.05

## 2020-11-29 MED ORDER — ADULT MULTIVITAMIN W/MINERALS CH
1.0000 | ORAL_TABLET | Freq: Every day | ORAL | Status: DC
  Administered 2020-11-29 – 2020-12-02 (×4): 1 via ORAL
  Filled 2020-11-29 (×4): qty 1

## 2020-11-29 MED ORDER — GLUCERNA SHAKE PO LIQD
237.0000 mL | Freq: Two times a day (BID) | ORAL | Status: DC
  Administered 2020-11-29 – 2020-12-01 (×5): 237 mL via ORAL
  Filled 2020-11-29 (×6): qty 237

## 2020-11-29 MED ORDER — INSULIN GLARGINE 100 UNIT/ML ~~LOC~~ SOLN
5.0000 [IU] | SUBCUTANEOUS | Status: AC
  Administered 2020-11-29: 5 [IU] via SUBCUTANEOUS
  Filled 2020-11-29: qty 0.05

## 2020-11-29 NOTE — Progress Notes (Addendum)
Inpatient Diabetes Program Recommendations  AACE/ADA: New Consensus Statement on Inpatient Glycemic Control (2015)  Target Ranges:  Prepandial:   less than 140 mg/dL      Peak postprandial:   less than 180 mg/dL (1-2 hours)      Critically ill patients:  140 - 180 mg/dL   Lab Results  Component Value Date   GLUCAP 330 (H) 11/29/2020   HGBA1C 7.4 (H) 04/10/2019    Review of Glycemic Control Results for Jessica Lara, Jessica Lara (MRN [id number]) as of 11/29/2020 11:34  Ref. Range 11/29/2020 07:46 11/29/2020 11:20  Glucose-Capillary Latest Ref Range: 70 - 99 mg/dL 201 (H) 330 (H)    Current orders for Inpatient glycemic control:  Lantus 5 units  Novolog 0-20 units TID and 0-5 units QHS  Inpatient Diabetes Program Recommendations:     Novolog 4 units TID with meals if eats at least 50% Lantus 10 units daily  Addendum@ 1247:  A1C resulted > 15.5%.  Patient is uninsured. Will need affordable insulin at discharge and assistance from [facility] as she has no income.  Ordered LWWD booklet and insulin starter kit.  Please use each patient interaction to provide diabetes education. Please review Living Well with Diabetes booklet with the patient, have patient watch patient education videos on diabetes, and instruct on insulin administration. Please allow patient to be actively engaged with diabetes management by allowing patient to check own glucose and self-administer insulin injections. Diabetes Coordinator will follow up with patient and reinforce diabetes education.  Addendum@ 3716:  RCVEL with pt at bedside about new diagnosis. Discussed A1C results with her (>15.5%)  and explained what an A1C is, basic pathophysiology of DM Type 2, basic home care, basic diabetes diet nutrition principles, importance of checking CBGs and maintaining good CBG control to prevent long-term and short-term complications. Reviewed signs and symptoms of hyperglycemia and hypoglycemia and how to treat hypoglycemia at home. Also  reviewed blood sugar goals at home.  RNs to provide ongoing basic DM education at bedside with this patient. Have ordered educational booklet, insulin starter kit, and DM videos. Have also placed RD consult for DM diet education for this patient.   Educated patient on insulin pen use at home. Reviewed contents of insulin flexpen starter kit. Reviewed all steps of insulin pen including attachment of needle, 2-unit air shot, dialing up dose, giving injection, removing needle, disposal of sharps, storage of unused insulin, disposal of insulin etc. Patient able to provide successful return demonstration. Also reviewed troubleshooting with insulin pen. MD to give patient Rxs for insulin pens and insulin pen needles.  Discussed diet, CHO's and The Plate Method.  Encouraged exercise and explained how it can help decrease insulin resistance.  Provided patient with a glucometer.  Demonstrated use and asked her to check her blood sugar fasting, before lunch and before bedtime.  If CBG's <100 mg.dL at bedtime she should have a small snack.    She drinks a lot of Pepsi and regular sodas.  She state states she will eliminate these from her diet.  Will continue to follow for recommendations for 70/30 dosing prior to discharge.    She is aware that she needs close follow up with PCP for DM management.  She will bring her meter to her PCP appointment for review.    Will continue to follow while inpatient.  Thank you, Reche Dixon, RN, BSN Diabetes Coordinator Inpatient Diabetes Program [phone] (team pager from 8a-5p)

## 2020-11-29 NOTE — Progress Notes (Signed)
Spoke to patient's cousin, [place], this morning to provide update on patient condition. [place] expressed concerns about patient's mental condition, specifically related to her tearfulness, constant need for someone to be with her, and fears that she will harm herself "for attention". Advised that psych consult expected to be completed prior to discharge. [place] expressed her appreciation for assistance and care.

## 2020-11-29 NOTE — Progress Notes (Signed)
Discussed transition off Insulin drip with Dr. Sidney Ace, hospital coverage MD. Patient's anion gap is 8, K 3.1, and last four blood sugar checks are below 200. Verbal orders for Lantus 5 Units now, then 5 Units every night at bedtime, Resistant scale Novolog, AC/HS blood sugar checks, and carb modified diet starting at breakfast. Read back orders and entered into chart.

## 2020-11-29 NOTE — Progress Notes (Signed)
Pt was unable to tolerate runs of K this AM, repeat K was 4.2 before PO K was given. Per Dr. Nevada Crane IV K can be discontinued d/t improved K. This RN will continue to carefully monitor pt.

## 2020-11-29 NOTE — Progress Notes (Signed)
Initial Nutrition Assessment  DOCUMENTATION CODES:   Not applicable  INTERVENTION:  - will order Glucerna Shake BID, each supplement provides 220 kcal and 10 grams of protein - will order 1 tablet multivitamin with minerals/day. - complete NFPE when feasible.    NUTRITION DIAGNOSIS:   Inadequate oral intake related to other (see comment) (lack of interest in eating PTA) as evidenced by per patient/family report  GOAL:   Patient will meet greater than or equal to 90% of their needs  MONITOR:   PO intake,Supplement acceptance,Labs,Weight trends  REASON FOR ASSESSMENT:   Malnutrition Screening Tool  ASSESSMENT:   60 y.o. female with medical history of HTN, HLD, anxiety, and arthritis. She presented to the ED with possible syncopal episode and hyperglycemia; she was found down by family. In the ED she was noted to be an unwilling historian, but did state "I just want to end it all". Her CBG was 600 mg/dl.  Patient laying in bed with no family or visitors present. Able to talk with RN prior to visiting patient. Sitter at bedside.   Patient ate 100% of breakfast but reports that it was cold when she received it and that she is already feeling very hungry, experiencing hunger pains and slight nausea d/t hunger. Ordered lunch: sandwich with ham, [place], tomato, lettuce, and mayo, a side caesar salad, and diet ginger ale.   She reports that she has been eating very poorly for the past 1 month. If she eats it is typically a caesar salad or sandwich like the one ordered for lunch. Reports that poor intakes is related to lack of desire to eat.   She then becomes tearful and expresses that she is very hurt and lonely because she does not have anyone to talk to other than a cousin who she feels is becoming annoyed with her. She reports that the rest of her family, including her daughter, refuse to talk to her or be a part of her life.   Weight today is 124 lb, weight yesterday was 122 lb,  and PTA the most recently documented weight was on 04/10/19 when she weighed 141 lb. This indicates 19 lb weight loss (13% body weight) in the past 7 months.   Per notes: - hyperglycemia concerning for new dx of DM - possible syncope - depression--Psych consulted d/t SI   Labs reviewed; CBGs: 142-330 mg/dl, Na: 134 mmol/l. Medications reviewed; sliding scale novolog, 5 units lantus/day, 40 mEq Klor-Con x2 doses 5/27. IVF; LR @ 125 ml/hr when CBGs >250 mg/dl; D5-LR @ 125 ml/hr when CBGs <250 mg/dl (510 kcal/24 hrs).    NUTRITION - FOCUSED PHYSICAL EXAM:  unable to complete at this time.   Diet Order:   Diet Order            Diet Carb Modified Fluid consistency: Thin; Room service appropriate? Yes  Diet effective 0500                 EDUCATION NEEDS:   Not appropriate for education at this time  Skin:  Skin Assessment: Reviewed RN Assessment  Last BM:  PTA/unknown  Height:   Ht Readings from Last 1 Encounters:  11/28/20 5\' 6"  (1.676 m)    Weight:   Wt Readings from Last 1 Encounters:  11/29/20 56.4 kg     Estimated Nutritional Needs:  Kcal:  1650-1850 kcal Protein:  75-85 grams Fluid:  >/= 1.8 L/day      Jarome Matin, MS, RD, LDN, CNSC Inpatient Clinical Dietitian  RD pager # available in [facility]  After hours/weekend pager # available in [facility]

## 2020-11-29 NOTE — Progress Notes (Signed)
PROGRESS NOTE  Jessica Lara DOB: 1960/08/27 DOA: 11/28/2020 PCP: Biagio Borg, MD  HPI/Recap of past 24 hours: Jessica Lara is a 60 y.o. female with medical history significant of HTN, HLD. Presenting with possible syncopal episode and hyperglycemia. Patient is an unwilling historian. Multiple family members were contacted and were either unwilling to talk or did not know what happened. She was apparently found down by family this morning. They called EMS. Her glucose was checked at the scene by EMS and found to be 600. She was transported to the ED for help. Per EDPA note, patient was tearful during her interview, stating "I just want to end it all." She was otherwise uncooperative with interview.    ED Course: Her glucose was found to be 809. She didn't have a high anion gap and neither was her bicarb low. She was started on insulin gtt. [facility] was called for admission.   11/29/20: Patient was seen and examined at her bedside.  Denies any nausea or abdominal pain.  Main complaint is" I have nobody, nobody cares about me" then started sobbing.  Assessment/Plan: Active Problems:   Hyperglycemia  Newly diagnosed diabetes, unspecified type, with hyperglycemia Not previously on hypoglycemic agents Hemoglobin A1c greater than 15.5% on 11/28/2020 Obtain C-peptide and GAD antibodies to help determine type of diabetes. She has been transitioned to long-acting and short acting subcu insulin Diabetes coordinator for patient's education.  Possible syncope Found down on floor the morning of admission. UDS positive for THC on 11/28/2020. Positive orthostatic vital signs, continue IV fluid, and fall precautions.     - trp neg x 2, EKG sinus w/ some repol abnormalities     - EtOH neg, salicylates neg, LFTs neg     - no respiratory distress, no tachycardia     - neg CTH, CT c-spine, CXR     -2D echo pending  Chronic depression with concern for high risk suicide When asked if she  has thoughts about hurting herself she cries and states that nobody cares about her.  "I just want to end it all." Resume home antidepressants when confirmed Last QTc 461 on 11/28/2020, repeat EKG Continue one-to-one sitter for patient's own safety Psych consulted, appreciate assistance.  Essential hypertension, BPs are soft On oral antihypertensive prior to admission, continue to hold all. BPs have been soft despite IV fluid hydration. Continue to closely monitor vital signs and maintain MAP greater than 65.  Pseudohyponatremia in the setting of hyperglycemia. Corrected serum Na+ for glucose is ~140   DVT prophylaxis: Subcu Lovenox daily. Code Status: FULL  Family Communication:  On at bedside.  She states we could call her cousin.    Consults called:  Psychiatry.  Status is: Inpatient  Remains inpatient appropriate because:Inpatient level of care appropriate due to severity of illness    Status is: Inpatient    Dispo: The patient is from: Home.              Anticipated d/c is to: Undetermined.               Patient currently not safe for discharge due to ongoing management of likely newly diagnosed diabetes, unspecified type.   Difficult to place patient, not applicable.        Objective: Vitals:   11/29/20 1100 11/29/20 1128 11/29/20 1200 11/29/20 1300  BP: 108/70  (!) 106/47   Pulse: 82  73 69  Resp: (!) 22  17 18   Temp:  Marland Kitchen)  97.5 F (36.4 C)    TempSrc:  Axillary    SpO2: 97%  97% 98%  Weight:      Height:        Intake/Output Summary (Last 24 hours) at 11/29/2020 1320 Last data filed at 11/29/2020 2774 Gross per 24 hour  Intake 2882.36 ml  Output 875 ml  Net 2007.36 ml   Filed Weights   11/28/20 1800 11/29/20 0500  Weight: 55.3 kg 56.4 kg    Exam:  . General: 60 y.o. year-old female frail-appearing in no acute distress.  Alert and oriented x3.  Tearful. . Cardiovascular: Regular rate and rhythm with no rubs or gallops.  No thyromegaly or  JVD noted.   Marland Kitchen Respiratory: Clear to auscultation with no wheezes or rales. Good inspiratory effort. . Abdomen: Soft nontender nondistended with normal bowel sounds x4 quadrants. . Musculoskeletal: No lower extremity edema.  . Skin: No ulcerative lesions noted or rashes, . Psychiatry: Mood is tearful.   Data Reviewed: CBC: Recent Labs  Lab 11/28/20 1040 11/29/20 0241  WBC 6.7 6.7  HGB 12.4 11.2*  HCT 35.3* 32.9*  MCV 85.3 87.0  PLT 276 128   Basic Metabolic Panel: Recent Labs  Lab 11/28/20 1919 11/28/20 2228 11/29/20 0241 11/29/20 0723 11/29/20 1052  NA 138 137 138 135 134*  K 3.2* 3.1* 2.9* 4.2 4.1  CL 100 101 104 104 100  CO2 26 28 26  21* 22  GLUCOSE 209* 194* 156* 193* 311*  BUN 13 10 10 8 13   CREATININE 0.63 0.67 0.66 0.79 1.00  CALCIUM 9.8 9.5 9.6 9.6 9.6  MG  --   --  2.0  --   --    GFR: Estimated Creatinine Clearance: 53.9 mL/min (by C-G formula based on SCr of 1 mg/dL). Liver Function Tests: Recent Labs  Lab 11/28/20 1108  AST 20  ALT 20  ALKPHOS 79  BILITOT 0.3  PROT 8.3*  ALBUMIN 4.2   No results for input(s): LIPASE, AMYLASE in the last 168 hours. No results for input(s): AMMONIA in the last 168 hours. Coagulation Profile: No results for input(s): INR, PROTIME in the last 168 hours. Cardiac Enzymes: No results for input(s): CKTOTAL, CKMB, CKMBINDEX, TROPONINI in the last 168 hours. BNP (last 3 results) No results for input(s): PROBNP in the last 8760 hours. HbA1C: Recent Labs    11/28/20 1040  HGBA1C >15.5*   CBG: Recent Labs  Lab 11/29/20 0156 11/29/20 0300 11/29/20 0500 11/29/20 0746 11/29/20 1120  GLUCAP 158* 160* 163* 201* 330*   Lipid Profile: No results for input(s): CHOL, HDL, LDLCALC, TRIG, CHOLHDL, LDLDIRECT in the last 72 hours. Thyroid Function Tests: No results for input(s): TSH, T4TOTAL, FREET4, T3FREE, THYROIDAB in the last 72 hours. Anemia Panel: No results for input(s): VITAMINB12, FOLATE, FERRITIN, TIBC,  IRON, RETICCTPCT in the last 72 hours. Urine analysis:    Component Value Date/Time   COLORURINE COLORLESS (A) 11/28/2020 1040   APPEARANCEUR CLEAR 11/28/2020 1040   LABSPEC 1.018 11/28/2020 1040   PHURINE 7.0 11/28/2020 1040   GLUCOSEU >=500 (A) 11/28/2020 1040   GLUCOSEU NEGATIVE 04/10/2019 1549   HGBUR NEGATIVE 11/28/2020 1040   BILIRUBINUR NEGATIVE 11/28/2020 1040   KETONESUR NEGATIVE 11/28/2020 1040   PROTEINUR NEGATIVE 11/28/2020 1040   UROBILINOGEN 0.2 04/10/2019 1549   NITRITE NEGATIVE 11/28/2020 1040   LEUKOCYTESUR NEGATIVE 11/28/2020 1040   Sepsis Labs: @LABRCNTIP (procalcitonin:4,lacticidven:4)  ) Recent Results (from the past 240 hour(s))  Resp Panel by RT-PCR (Flu A&B, Covid) Nasopharyngeal Swab  Status: None   Collection Time: 11/28/20  1:45 PM   Specimen: Nasopharyngeal Swab; Nasopharyngeal(NP) swabs in vial transport medium  Result Value Ref Range Status   SARS Coronavirus 2 by RT PCR NEGATIVE NEGATIVE Final    Comment: (NOTE) SARS-CoV-2 target nucleic acids are NOT DETECTED.  The SARS-CoV-2 RNA is generally detectable in upper respiratory specimens during the acute phase of infection. The lowest concentration of SARS-CoV-2 viral copies this assay can detect is 138 copies/mL. A negative result does not preclude SARS-Cov-2 infection and should not be used as the sole basis for treatment or other patient management decisions. A negative result may occur with  improper specimen collection/handling, submission of specimen other than nasopharyngeal swab, presence of viral mutation(s) within the areas targeted by this assay, and inadequate number of viral copies(<138 copies/mL). A negative result must be combined with clinical observations, patient history, and epidemiological information. The expected result is Negative.  Fact Sheet for Patients:  EntrepreneurPulse.com.au  Fact Sheet for Healthcare Providers:   IncredibleEmployment.be  This test is no t yet approved or cleared by the [place] FDA and  has been authorized for detection and/or diagnosis of SARS-CoV-2 by FDA under an Emergency Use Authorization (EUA). This EUA will remain  in effect (meaning this test can be used) for the duration of the COVID-19 declaration under Section 564(b)(1) of the Act, 21 U.S.C.section 360bbb-3(b)(1), unless the authorization is terminated  or revoked sooner.       Influenza A by PCR NEGATIVE NEGATIVE Final   Influenza B by PCR NEGATIVE NEGATIVE Final    Comment: (NOTE) The Xpert Xpress SARS-CoV-2/FLU/RSV plus assay is intended as an aid in the diagnosis of influenza from Nasopharyngeal swab specimens and should not be used as a sole basis for treatment. Nasal washings and aspirates are unacceptable for Xpert Xpress SARS-CoV-2/FLU/RSV testing.  Fact Sheet for Patients: EntrepreneurPulse.com.au  Fact Sheet for Healthcare Providers: IncredibleEmployment.be  This test is not yet approved or cleared by the [place] FDA and has been authorized for detection and/or diagnosis of SARS-CoV-2 by FDA under an Emergency Use Authorization (EUA). This EUA will remain in effect (meaning this test can be used) for the duration of the COVID-19 declaration under Section 564(b)(1) of the Act, 21 U.S.C. section 360bbb-3(b)(1), unless the authorization is terminated or revoked.  Performed at [facility], [place] [address] [place], [place] [address]   MRSA PCR Screening     Status: None   Collection Time: 11/28/20  4:47 PM   Specimen: Nasal Mucosa; Nasopharyngeal  Result Value Ref Range Status   MRSA by PCR NEGATIVE NEGATIVE Final    Comment:        The GeneXpert MRSA Assay (FDA approved for NASAL specimens only), is one component of a comprehensive MRSA colonization surveillance program. It is not intended to diagnose  MRSA infection nor to guide or monitor treatment for MRSA infections. Performed at [facility], [place] [address], [place], [place] [address]       Studies: No results found.  Scheduled Meds: . Chlorhexidine Gluconate Cloth  6 each Topical Q0600  . enoxaparin (LOVENOX) injection  40 mg Subcutaneous Q24H  . feeding supplement (GLUCERNA SHAKE)  237 mL Oral BID BM  . insulin aspart  0-20 Units Subcutaneous TID WC  . insulin aspart  0-5 Units Subcutaneous QHS  . insulin glargine  5 Units Subcutaneous QHS  . insulin starter kit- pen needles  1 kit Other Once  . living well  with diabetes book   Does not apply Once  . multivitamin with minerals  1 tablet Oral Daily  . nicotine  21 mg Transdermal Daily  . sodium chloride flush  3 mL Intravenous Q12H    Continuous Infusions: . dextrose 5% lactated ringers Stopped (11/29/20 0507)  . insulin Stopped (11/29/20 0504)  . lactated ringers 125 mL/hr at 11/28/20 1300     LOS: 1 day     Kayleen Memos, MD [facility] Pager [phone]  If 7PM-7AM, please contact night-coverage [email or web address] Password [email or web address] 11/29/2020, 1:20 PM

## 2020-11-30 DIAGNOSIS — F331 Major depressive disorder, recurrent, moderate: Secondary | ICD-10-CM

## 2020-11-30 LAB — BASIC METABOLIC PANEL
Anion gap: 7 (ref 5–15)
BUN: 13 mg/dL (ref 6–20)
CO2: 21 mmol/L — ABNORMAL LOW (ref 22–32)
Calcium: 9.1 mg/dL (ref 8.9–10.3)
Chloride: 107 mmol/L (ref 98–111)
Creatinine, Ser: 0.46 mg/dL (ref 0.44–1.00)
GFR, Estimated: >60 mL/min (ref >60)
Glucose, Bld: 283 mg/dL — ABNORMAL HIGH (ref 70–99)
Potassium: 3.9 mmol/L (ref 3.5–5.1)
Sodium: 135 mmol/L (ref 135–145)

## 2020-11-30 LAB — CBC
HCT: 30 % — ABNORMAL LOW (ref 36.0–46.0)
HCT: 31 % — ABNORMAL LOW (ref 36.0–46.0)
Hemoglobin: 10.3 g/dL — ABNORMAL LOW (ref 12.0–15.0)
Hemoglobin: 10.4 g/dL — ABNORMAL LOW (ref 12.0–15.0)
MCH: 30.4 pg (ref 26.0–34.0)
MCH: 30.7 pg (ref 26.0–34.0)
MCHC: 34.3 g/dL (ref 30.0–36.0)
MCHC: 33.5 g/dL (ref 30.0–36.0)
MCV: 88.5 fL (ref 80.0–100.0)
MCV: 91.4 fL (ref 80.0–100.0)
Platelets: 235 10*3/uL (ref 150–400)
Platelets: 230 10*3/uL (ref 150–400)
RBC: 3.39 MIL/uL — ABNORMAL LOW (ref 3.87–5.11)
RBC: 3.39 MIL/uL — ABNORMAL LOW (ref 3.87–5.11)
RDW: 13.9 % (ref 11.5–15.5)
RDW: 13.9 % (ref 11.5–15.5)
WBC: 6.2 10*3/uL (ref 4.0–10.5)
WBC: 5.2 10*3/uL (ref 4.0–10.5)
nRBC: 0 % (ref 0.0–0.2)
nRBC: 0 % (ref 0.0–0.2)

## 2020-11-30 LAB — COMPREHENSIVE METABOLIC PANEL
ALT: 11 U/L (ref 0–44)
AST: 21 U/L (ref 15–41)
Albumin: 3.4 g/dL — ABNORMAL LOW (ref 3.5–5.0)
Alkaline Phosphatase: 73 U/L (ref 38–126)
Anion gap: 6 (ref 5–15)
BUN: 16 mg/dL (ref 6–20)
CO2: 23 mmol/L (ref 22–32)
Calcium: 9.1 mg/dL (ref 8.9–10.3)
Chloride: 105 mmol/L (ref 98–111)
Creatinine, Ser: 0.68 mg/dL (ref 0.44–1.00)
GFR, Estimated: >60 mL/min (ref >60)
Glucose, Bld: 256 mg/dL — ABNORMAL HIGH (ref 70–99)
Potassium: 3.9 mmol/L (ref 3.5–5.1)
Sodium: 134 mmol/L — ABNORMAL LOW (ref 135–145)
Total Bilirubin: 0.3 mg/dL (ref 0.3–1.2)
Total Protein: 6.1 g/dL — ABNORMAL LOW (ref 6.5–8.1)

## 2020-11-30 LAB — LIPID PANEL
Cholesterol: 218 mg/dL — ABNORMAL HIGH (ref 0–200)
HDL: 24 mg/dL — ABNORMAL LOW (ref >40)
LDL Cholesterol: UNDETERMINED mg/dL (ref 0–99)
Total CHOL/HDL Ratio: 9.1 RATIO
Triglycerides: 862 mg/dL — ABNORMAL HIGH (ref <150)
VLDL: UNDETERMINED mg/dL (ref 0–40)

## 2020-11-30 LAB — LDL CHOLESTEROL, DIRECT: Direct LDL: 86.1 mg/dL (ref 0–99)

## 2020-11-30 LAB — GLUTAMIC ACID DECARBOXYLASE AUTO ABS: Glutamic Acid Decarb Ab: <5 U/mL (ref 0.0–5.0)

## 2020-11-30 LAB — PHOSPHORUS
Phosphorus: 4.2 mg/dL (ref 2.5–4.6)
Phosphorus: 4.2 mg/dL (ref 2.5–4.6)

## 2020-11-30 LAB — GLUCOSE, CAPILLARY
Glucose-Capillary: 324 mg/dL — ABNORMAL HIGH (ref 70–99)
Glucose-Capillary: 256 mg/dL — ABNORMAL HIGH (ref 70–99)
Glucose-Capillary: 154 mg/dL — ABNORMAL HIGH (ref 70–99)
Glucose-Capillary: 224 mg/dL — ABNORMAL HIGH (ref 70–99)

## 2020-11-30 LAB — MAGNESIUM
Magnesium: 2.1 mg/dL (ref 1.7–2.4)
Magnesium: 2 mg/dL (ref 1.7–2.4)

## 2020-11-30 LAB — C-PEPTIDE: C-Peptide: 2.2 ng/mL (ref 1.1–4.4)

## 2020-11-30 MED ORDER — DULOXETINE HCL 30 MG PO CPEP
30.0000 mg | ORAL_CAPSULE | Freq: Every day | ORAL | Status: DC
  Administered 2020-12-01 – 2020-12-02 (×2): 30 mg via ORAL
  Filled 2020-11-30 (×2): qty 1

## 2020-11-30 MED ORDER — ATORVASTATIN CALCIUM 40 MG PO TABS
40.0000 mg | ORAL_TABLET | Freq: Every day | ORAL | Status: DC
  Administered 2020-11-30 – 2020-12-02 (×3): 40 mg via ORAL
  Filled 2020-11-30 (×3): qty 1

## 2020-11-30 MED ORDER — TRAZODONE HCL 50 MG PO TABS
50.0000 mg | ORAL_TABLET | Freq: Every evening | ORAL | Status: DC | PRN
  Administered 2020-11-30: 50 mg via ORAL
  Filled 2020-11-30: qty 1

## 2020-11-30 MED ORDER — INSULIN ASPART 100 UNIT/ML IJ SOLN
5.0000 [IU] | Freq: Three times a day (TID) | INTRAMUSCULAR | Status: DC
  Administered 2020-11-30 – 2020-12-01 (×2): 5 [IU] via SUBCUTANEOUS

## 2020-11-30 MED ORDER — INSULIN GLARGINE 100 UNIT/ML ~~LOC~~ SOLN
8.0000 [IU] | Freq: Two times a day (BID) | SUBCUTANEOUS | Status: DC
  Administered 2020-11-30: 8 [IU] via SUBCUTANEOUS
  Filled 2020-11-30: qty 0.08

## 2020-11-30 MED ORDER — INSULIN GLARGINE 100 UNIT/ML ~~LOC~~ SOLN
10.0000 [IU] | Freq: Two times a day (BID) | SUBCUTANEOUS | Status: DC
  Administered 2020-11-30 – 2020-12-01 (×2): 10 [IU] via SUBCUTANEOUS
  Filled 2020-11-30 (×2): qty 0.1

## 2020-11-30 MED ORDER — HYDROXYZINE HCL 25 MG PO TABS: 25.0000 mg | ORAL_TABLET | Freq: Four times a day (QID) | ORAL | Status: DC | PRN

## 2020-11-30 NOTE — Consult Note (Signed)
[facility] Consult   Reason for Consult:''suicidal ideation.'' Referring Physician: Irene Pap, DO Patient Identification: Jessica Lara MRN:  [id number] Principal Diagnosis: Major depressive disorder, recurrent episode, moderate (HCC) Diagnosis:  Principal Problem:   Major depressive disorder, recurrent episode, moderate ([place]) Active Problems:   Hyperglycemia   Total Time spent with patient: 1 hour  Subjective:   Jessica Lara is a 60 y.o. female patient admitted with hyperglycemia  HPI: Patient is a 60 y.o.femalewith medical history significant ofHTN, HLD who was admitted to the hospital due to possible syncopal episode and hyperglycemia. Patient reports history of Depression but non-complaint with her medication. However, she reports worsening stress, depression because her daughter has been preventing her from talking to her grand children. She also reports being stressed out because multiple family members are no longer talking to her. However, patient denies suicidal thoughts, psychosis and delusions. She states that she just want to be on anti-depressant and be referred to a therapist.  Past Psychiatric History: as above  Risk to Self:  denies Risk to Others:  denies Prior Inpatient Therapy:  none reported Prior Outpatient Therapy:    Past Medical History:  Past Medical History:  Diagnosis Date  . Anxiety   . ANXIETY DEPRESSION   . Arthritis   . Chicken pox   . H/O: hysterectomy   . Hypertension   . Other and unspecified hyperlipidemia   . SMOKER   . Unspecified essential hypertension     Past Surgical History:  Procedure Laterality Date  . ABDOMINAL HYSTERECTOMY  1999   fibroids  . COLONOSCOPY    . ESOPHAGOGASTRODUODENOSCOPY  01/09/2012   Procedure: ESOPHAGOGASTRODUODENOSCOPY (EGD);  Surgeon: Irene Shipper, MD;  Location: [facility] ENDOSCOPY;  Service: Endoscopy;  Laterality: N/A;  . single oopherectomy  1999   Family History:  Family History   Problem Relation Age of Onset  . Diabetes Mother   . Hypertension Mother   . Hyperlipidemia Mother   . Stomach cancer Father   . Heart disease Sister   . Hypertension Brother   . Diabetes Brother   . Colon cancer Neg Hx   . Esophageal cancer Neg Hx   . Rectal cancer Neg Hx   . Colon polyps Neg Hx    Family Psychiatric  History:  Social History:  Social History   Substance and Sexual Activity  Alcohol Use Yes  . Alcohol/week: 2.0 standard drinks  . Types: 2 Cans of beer per week     Social History   Substance and Sexual Activity  Drug Use No    Social History   Socioeconomic History  . Marital status: Single    Spouse name: Not on file  . Number of children: 1  . Years of education: 77  . Highest education level: Not on file  Occupational History  . Occupation: Corporate treasurer  Tobacco Use  . Smoking status: Current Every Day Smoker    Packs/day: 1.00    Years: 33.00    Pack years: 33.00    Types: Cigarettes  . Smokeless tobacco: Never Used  Vaping Use  . Vaping Use: Never used  Substance and Sexual Activity  . Alcohol use: Yes    Alcohol/week: 2.0 standard drinks    Types: 2 Cans of beer per week  . Drug use: No  . Sexual activity: Yes    Birth control/protection: None  Other Topics Concern  . Not on file  Social History Narrative   Fun: Watch TV  Denies abuse and feels safe at home.    Social Determinants of Health   Financial Resource Strain: Not on file  Food Insecurity: Not on file  Transportation Needs: Not on file  Physical Activity: Not on file  Stress: Not on file  Social Connections: Not on file   Additional Social History:    Allergies:   Allergies  Allergen Reactions  . Lisinopril     cough    Labs:  Results for orders placed or performed during the hospital encounter of 11/28/20 (from the past 48 hour(s))  Basic metabolic panel     Status: Abnormal   Collection Time: 11/28/20  7:19 PM  Result Value Ref Range    Sodium 138 135 - 145 mmol/L   Potassium 3.2 (L) 3.5 - 5.1 mmol/L    Comment: DELTA CHECK NOTED   Chloride 100 98 - 111 mmol/L   CO2 26 22 - 32 mmol/L   Glucose, Bld 209 (H) 70 - 99 mg/dL    Comment: Glucose reference range applies only to samples taken after fasting for at least 8 hours.   BUN 13 6 - 20 mg/dL   Creatinine, Ser 0.63 0.44 - 1.00 mg/dL   Calcium 9.8 8.9 - 10.3 mg/dL   GFR, Estimated >60 >60 mL/min    Comment: (NOTE) Calculated using the CKD-EPI Creatinine Equation (2021)    Anion gap 12 5 - 15    Comment: Performed at [facility], [place] [address] [place], [place] [address]  Glucose, capillary     Status: Abnormal   Collection Time: 11/28/20  8:05 PM  Result Value Ref Range   Glucose-Capillary 212 (H) 70 - 99 mg/dL    Comment: Glucose reference range applies only to samples taken after fasting for at least 8 hours.  Glucose, capillary     Status: Abnormal   Collection Time: 11/28/20  8:58 PM  Result Value Ref Range   Glucose-Capillary 213 (H) 70 - 99 mg/dL    Comment: Glucose reference range applies only to samples taken after fasting for at least 8 hours.  Glucose, capillary     Status: Abnormal   Collection Time: 11/28/20 10:00 PM  Result Value Ref Range   Glucose-Capillary 233 (H) 70 - 99 mg/dL    Comment: Glucose reference range applies only to samples taken after fasting for at least 8 hours.  Basic metabolic panel     Status: Abnormal   Collection Time: 11/28/20 10:28 PM  Result Value Ref Range   Sodium 137 135 - 145 mmol/L   Potassium 3.1 (L) 3.5 - 5.1 mmol/L   Chloride 101 98 - 111 mmol/L   CO2 28 22 - 32 mmol/L   Glucose, Bld 194 (H) 70 - 99 mg/dL    Comment: Glucose reference range applies only to samples taken after fasting for at least 8 hours.   BUN 10 6 - 20 mg/dL   Creatinine, Ser 0.67 0.44 - 1.00 mg/dL   Calcium 9.5 8.9 - 10.3 mg/dL   GFR, Estimated >60 >60 mL/min    Comment: (NOTE) Calculated using the CKD-EPI Creatinine  Equation (2021)    Anion gap 8 5 - 15    Comment: Performed at [facility], [place] [address], [place], [place] [address]  Glucose, capillary     Status: Abnormal   Collection Time: 11/28/20 10:59 PM  Result Value Ref Range   Glucose-Capillary 182 (H) 70 - 99 mg/dL    Comment: Glucose reference range  applies only to samples taken after fasting for at least 8 hours.  Glucose, capillary     Status: Abnormal   Collection Time: 11/28/20 11:54 PM  Result Value Ref Range   Glucose-Capillary 135 (H) 70 - 99 mg/dL    Comment: Glucose reference range applies only to samples taken after fasting for at least 8 hours.  Glucose, capillary     Status: Abnormal   Collection Time: 11/29/20  1:06 AM  Result Value Ref Range   Glucose-Capillary 142 (H) 70 - 99 mg/dL    Comment: Glucose reference range applies only to samples taken after fasting for at least 8 hours.  Glucose, capillary     Status: Abnormal   Collection Time: 11/29/20  1:56 AM  Result Value Ref Range   Glucose-Capillary 158 (H) 70 - 99 mg/dL    Comment: Glucose reference range applies only to samples taken after fasting for at least 8 hours.  CBC     Status: Abnormal   Collection Time: 11/29/20  2:41 AM  Result Value Ref Range   WBC 6.7 4.0 - 10.5 K/uL   RBC 3.78 (L) 3.87 - 5.11 MIL/uL   Hemoglobin 11.2 (L) 12.0 - 15.0 g/dL   HCT 32.9 (L) 36.0 - 46.0 %   MCV 87.0 80.0 - 100.0 fL   MCH 29.6 26.0 - 34.0 pg   MCHC 34.0 30.0 - 36.0 g/dL   RDW 13.6 11.5 - 15.5 %   Platelets 244 150 - 400 K/uL   nRBC 0.0 0.0 - 0.2 %    Comment: Performed at [facility], [place] [address], [place], [place] [address]  HIV Antibody (routine testing w rflx)     Status: None   Collection Time: 11/29/20  2:41 AM  Result Value Ref Range   HIV Screen 4th Generation wRfx Non Reactive Non Reactive    Comment: Performed at [facility], [place] [address] [place], [place] [address]  Basic metabolic panel     Status:  Abnormal   Collection Time: 11/29/20  2:41 AM  Result Value Ref Range   Sodium 138 135 - 145 mmol/L   Potassium 2.9 (L) 3.5 - 5.1 mmol/L   Chloride 104 98 - 111 mmol/L   CO2 26 22 - 32 mmol/L   Glucose, Bld 156 (H) 70 - 99 mg/dL    Comment: Glucose reference range applies only to samples taken after fasting for at least 8 hours.   BUN 10 6 - 20 mg/dL   Creatinine, Ser 0.66 0.44 - 1.00 mg/dL   Calcium 9.6 8.9 - 10.3 mg/dL   GFR, Estimated >60 >60 mL/min    Comment: (NOTE) Calculated using the CKD-EPI Creatinine Equation (2021)    Anion gap 8 5 - 15    Comment: Performed at [facility], [place] [address] [place], [place] [address]  Magnesium     Status: None   Collection Time: 11/29/20  2:41 AM  Result Value Ref Range   Magnesium 2.0 1.7 - 2.4 mg/dL    Comment: Performed at [facility], [place] [address] [place], [place] [address]  Glucose, capillary     Status: Abnormal   Collection Time: 11/29/20  3:00 AM  Result Value Ref Range   Glucose-Capillary 160 (H) 70 - 99 mg/dL    Comment: Glucose reference range applies only to samples taken after fasting for at least 8 hours.  Glucose, capillary     Status: Abnormal   Collection  Time: 11/29/20  5:00 AM  Result Value Ref Range   Glucose-Capillary 163 (H) 70 - 99 mg/dL    Comment: Glucose reference range applies only to samples taken after fasting for at least 8 hours.  Basic metabolic panel     Status: Abnormal   Collection Time: 11/29/20  7:23 AM  Result Value Ref Range   Sodium 135 135 - 145 mmol/L   Potassium 4.2 3.5 - 5.1 mmol/L    Comment: DELTA CHECK NOTED NO VISIBLE HEMOLYSIS    Chloride 104 98 - 111 mmol/L   CO2 21 (L) 22 - 32 mmol/L   Glucose, Bld 193 (H) 70 - 99 mg/dL    Comment: Glucose reference range applies only to samples taken after fasting for at least 8 hours.   BUN 8 6 - 20 mg/dL   Creatinine, Ser 0.79 0.44 - 1.00 mg/dL   Calcium 9.6 8.9 - 10.3 mg/dL   GFR, Estimated >60  >60 mL/min    Comment: (NOTE) Calculated using the CKD-EPI Creatinine Equation (2021)    Anion gap 10 5 - 15    Comment: Performed at [facility], [place] [address] [place], [place] [address]  Glucose, capillary     Status: Abnormal   Collection Time: 11/29/20  7:46 AM  Result Value Ref Range   Glucose-Capillary 201 (H) 70 - 99 mg/dL    Comment: Glucose reference range applies only to samples taken after fasting for at least 8 hours.  Basic metabolic panel     Status: Abnormal   Collection Time: 11/29/20 10:52 AM  Result Value Ref Range   Sodium 134 (L) 135 - 145 mmol/L   Potassium 4.1 3.5 - 5.1 mmol/L   Chloride 100 98 - 111 mmol/L   CO2 22 22 - 32 mmol/L   Glucose, Bld 311 (H) 70 - 99 mg/dL    Comment: Glucose reference range applies only to samples taken after fasting for at least 8 hours.   BUN 13 6 - 20 mg/dL   Creatinine, Ser 1.00 0.44 - 1.00 mg/dL   Calcium 9.6 8.9 - 10.3 mg/dL   GFR, Estimated >60 >60 mL/min    Comment: (NOTE) Calculated using the CKD-EPI Creatinine Equation (2021)    Anion gap 12 5 - 15    Comment: Performed at [facility], [place] [address], [place], [place] [address]  Glucose, capillary     Status: Abnormal   Collection Time: 11/29/20 11:20 AM  Result Value Ref Range   Glucose-Capillary 330 (H) 70 - 99 mg/dL    Comment: Glucose reference range applies only to samples taken after fasting for at least 8 hours.   Comment 1 Notify RN    Comment 2 Document in Chart   C-peptide     Status: None   Collection Time: 11/29/20  1:50 PM  Result Value Ref Range   C-Peptide 2.2 1.1 - 4.4 ng/mL    Comment: (NOTE) C-Peptide reference interval is for fasting patients. Performed At: [facility] [place], [place] [id number] Rush Farmer MD [id number]   Glutamic acid decarboxylase auto abs     Status: None   Collection Time: 11/29/20  1:50 PM  Result Value Ref Range   Glutamic Acid Decarb Ab <5.0  0.0 - 5.0 U/mL    Comment: (NOTE) Performed At: [facility] [place], [place] [id number] Rush Farmer MD [id number]   Glucose, capillary     Status: Abnormal   Collection  Time: 11/29/20  4:41 PM  Result Value Ref Range   Glucose-Capillary 301 (H) 70 - 99 mg/dL    Comment: Glucose reference range applies only to samples taken after fasting for at least 8 hours.   Comment 1 Notify RN    Comment 2 Document in Chart   Glucose, capillary     Status: Abnormal   Collection Time: 11/29/20  7:45 PM  Result Value Ref Range   Glucose-Capillary 320 (H) 70 - 99 mg/dL    Comment: Glucose reference range applies only to samples taken after fasting for at least 8 hours.  Glucose, capillary     Status: Abnormal   Collection Time: 11/29/20  9:16 PM  Result Value Ref Range   Glucose-Capillary 292 (H) 70 - 99 mg/dL    Comment: Glucose reference range applies only to samples taken after fasting for at least 8 hours.  CBC     Status: Abnormal   Collection Time: 11/30/20  1:14 AM  Result Value Ref Range   WBC 6.2 4.0 - 10.5 K/uL   RBC 3.39 (L) 3.87 - 5.11 MIL/uL   Hemoglobin 10.3 (L) 12.0 - 15.0 g/dL   HCT 30.0 (L) 36.0 - 46.0 %   MCV 88.5 80.0 - 100.0 fL   MCH 30.4 26.0 - 34.0 pg   MCHC 34.3 30.0 - 36.0 g/dL   RDW 13.9 11.5 - 15.5 %   Platelets 235 150 - 400 K/uL   nRBC 0.0 0.0 - 0.2 %    Comment: Performed at [facility], [place] [address], [place], [place] [address]  Comprehensive metabolic panel     Status: Abnormal   Collection Time: 11/30/20  1:14 AM  Result Value Ref Range   Sodium 134 (L) 135 - 145 mmol/L   Potassium 3.9 3.5 - 5.1 mmol/L   Chloride 105 98 - 111 mmol/L   CO2 23 22 - 32 mmol/L   Glucose, Bld 256 (H) 70 - 99 mg/dL    Comment: Glucose reference range applies only to samples taken after fasting for at least 8 hours.   BUN 16 6 - 20 mg/dL   Creatinine, Ser 0.68 0.44 - 1.00 mg/dL   Calcium 9.1 8.9 - 10.3 mg/dL   Total Protein  6.1 (L) 6.5 - 8.1 g/dL   Albumin 3.4 (L) 3.5 - 5.0 g/dL   AST 21 15 - 41 U/L   ALT 11 0 - 44 U/L   Alkaline Phosphatase 73 38 - 126 U/L   Total Bilirubin 0.3 0.3 - 1.2 mg/dL   GFR, Estimated >60 >60 mL/min    Comment: (NOTE) Calculated using the CKD-EPI Creatinine Equation (2021)    Anion gap 6 5 - 15    Comment: Performed at [facility], [place] [address] [place], [place] [address]  Lipid panel     Status: Abnormal   Collection Time: 11/30/20  1:14 AM  Result Value Ref Range   Cholesterol 218 (H) 0 - 200 mg/dL   Triglycerides 862 (H) <150 mg/dL   HDL 24 (L) >40 mg/dL   Total CHOL/HDL Ratio 9.1 RATIO   VLDL UNABLE TO CALCULATE IF TRIGLYCERIDE OVER 400 mg/dL 0 - 40 mg/dL   LDL Cholesterol UNABLE TO CALCULATE IF TRIGLYCERIDE OVER 400 mg/dL 0 - 99 mg/dL    Comment:        Total Cholesterol/HDL:CHD Risk Coronary Heart Disease Risk Table  Men   Women  1/2 Average Risk   3.4   3.3  Average Risk       5.0   4.4  2 X Average Risk   9.6   7.1  3 X Average Risk  23.4   11.0        Use the calculated Patient Ratio above and the CHD Risk Table to determine the patient's CHD Risk.        ATP III CLASSIFICATION (LDL):  <100     mg/dL   Optimal  100-129  mg/dL   Near or Above                    Optimal  130-159  mg/dL   Borderline  160-189  mg/dL   High  >190     mg/dL   Very High Performed at [place] [address], [place], [place] [address]   Magnesium     Status: None   Collection Time: 11/30/20  1:14 AM  Result Value Ref Range   Magnesium 2.1 1.7 - 2.4 mg/dL    Comment: Performed at [facility], [place] [address] [place], [place] [address]  Phosphorus     Status: None   Collection Time: 11/30/20  1:14 AM  Result Value Ref Range   Phosphorus 4.2 2.5 - 4.6 mg/dL    Comment: Performed at [facility], [place] [address] [place], [place] [address]  Basic metabolic panel     Status:  Abnormal   Collection Time: 11/30/20  5:29 AM  Result Value Ref Range   Sodium 135 135 - 145 mmol/L   Potassium 3.9 3.5 - 5.1 mmol/L   Chloride 107 98 - 111 mmol/L   CO2 21 (L) 22 - 32 mmol/L   Glucose, Bld 283 (H) 70 - 99 mg/dL    Comment: Glucose reference range applies only to samples taken after fasting for at least 8 hours.   BUN 13 6 - 20 mg/dL   Creatinine, Ser 0.46 0.44 - 1.00 mg/dL   Calcium 9.1 8.9 - 10.3 mg/dL   GFR, Estimated >60 >60 mL/min    Comment: (NOTE) Calculated using the CKD-EPI Creatinine Equation (2021)    Anion gap 7 5 - 15    Comment: Performed at [facility], [place] [address], [place], [place] [address]  CBC     Status: Abnormal   Collection Time: 11/30/20  5:29 AM  Result Value Ref Range   WBC 5.2 4.0 - 10.5 K/uL   RBC 3.39 (L) 3.87 - 5.11 MIL/uL   Hemoglobin 10.4 (L) 12.0 - 15.0 g/dL   HCT 31.0 (L) 36.0 - 46.0 %   MCV 91.4 80.0 - 100.0 fL   MCH 30.7 26.0 - 34.0 pg   MCHC 33.5 30.0 - 36.0 g/dL   RDW 13.9 11.5 - 15.5 %   Platelets 230 150 - 400 K/uL   nRBC 0.0 0.0 - 0.2 %    Comment: Performed at [facility], [place] [address], [place], [place] [address]  Magnesium     Status: None   Collection Time: 11/30/20  5:29 AM  Result Value Ref Range   Magnesium 2.0 1.7 - 2.4 mg/dL    Comment: Performed at [facility], [place] [address], [place],  [address]  Phosphorus     Status: None   Collection Time: 11/30/20  5:29 AM  Result Value Ref Range   Phosphorus  4.2 2.5 - 4.6 mg/dL    Comment: Performed at [facility], [place] [address] [place], [place] [address]  Glucose, capillary     Status: Abnormal   Collection Time: 11/30/20  8:01 AM  Result Value Ref Range   Glucose-Capillary 324 (H) 70 - 99 mg/dL    Comment: Glucose reference range applies only to samples taken after fasting for at least 8 hours.  Glucose, capillary     Status: Abnormal   Collection Time: 11/30/20 11:52 AM   Result Value Ref Range   Glucose-Capillary 256 (H) 70 - 99 mg/dL    Comment: Glucose reference range applies only to samples taken after fasting for at least 8 hours.  Glucose, capillary     Status: Abnormal   Collection Time: 11/30/20  4:36 PM  Result Value Ref Range   Glucose-Capillary 154 (H) 70 - 99 mg/dL    Comment: Glucose reference range applies only to samples taken after fasting for at least 8 hours.    Current Facility-Administered Medications  Medication Dose Route Frequency Provider Last Rate Last Admin  . acetaminophen (TYLENOL) tablet 650 mg  650 mg Oral Q6H PRN Marylyn Ishihara, Tyrone A, DO   650 mg at 11/29/20 1014   Or  . acetaminophen (TYLENOL) suppository 650 mg  650 mg Rectal Q6H PRN Marylyn Ishihara, Tyrone A, DO      . atorvastatin (LIPITOR) tablet 40 mg  40 mg Oral Daily Irene Pap N, DO   40 mg at 11/30/20 0936  . Chlorhexidine Gluconate Cloth 2 % PADS 6 each  6 each Topical Q0600 Cherylann Ratel A, DO   6 each at 11/30/20 0504  . dextrose 50 % solution 0-50 mL  0-50 mL Intravenous PRN Marylyn Ishihara, Tyrone A, DO      . [START ON 12/01/2020] DULoxetine (CYMBALTA) DR capsule 30 mg  30 mg Oral Daily Cloyde Oregel, MD      . enoxaparin (LOVENOX) injection 40 mg  40 mg Subcutaneous Q24H Kyle, Tyrone A, DO   40 mg at 11/29/20 2144  . feeding supplement (GLUCERNA SHAKE) (GLUCERNA SHAKE) liquid 237 mL  237 mL Oral BID BM Irene Pap N, DO   237 mL at 11/30/20 7026  . hydrOXYzine (ATARAX/VISTARIL) tablet 25 mg  25 mg Oral Q6H PRN Irene Pap N, DO      . insulin aspart (novoLOG) injection 0-20 Units  0-20 Units Subcutaneous TID WC Mansy, Arvella Merles, MD   4 Units at 11/30/20 1647  . insulin aspart (novoLOG) injection 0-5 Units  0-5 Units Subcutaneous QHS Mansy, Arvella Merles, MD   3 Units at 11/29/20 2147  . insulin aspart (novoLOG) injection 5 Units  5 Units Subcutaneous TID WC Irene Pap N, DO   5 Units at 11/30/20 1647  . insulin glargine (LANTUS) injection 10 Units  10 Units Subcutaneous BID Irene Pap N, DO       . multivitamin with minerals tablet 1 tablet  1 tablet Oral Daily Irene Pap N, DO   1 tablet at 11/30/20 3785  . nicotine (NICODERM CQ - dosed in mg/24 hours) patch 21 mg  21 mg Transdermal Daily Mansy, Jan A, MD   21 mg at 11/30/20 8850  . ondansetron (ZOFRAN) tablet 4 mg  4 mg Oral Q6H PRN Marylyn Ishihara, Tyrone A, DO       Or  . ondansetron (ZOFRAN) injection 4 mg  4 mg Intravenous Q6H PRN Kyle, Tyrone A, DO      . sodium chloride flush (  NS) 0.9 % injection 3 mL  3 mL Intravenous Q12H Kyle, Tyrone A, DO   3 mL at 11/30/20 0017    Musculoskeletal: Strength & Muscle Tone: not tested Gait & Station: not tested Patient leans: N/A    Psychiatric Specialty Exam:  Presentation  General Appearance: Appropriate for Environment; Well Groomed  Eye Contact:Fair  Speech:Clear and Coherent  Speech Volume:Decreased  Handedness:Right   Mood and Affect  Mood:Depressed  Affect:Constricted; Tearful   Thought Process  Thought Processes:Linear  Descriptions of Associations:Intact  Orientation:Full (Time, Place and Person)  Thought Content:Logical  History of Schizophrenia/Schizoaffective disorder:No data recorded Duration of Psychotic Symptoms:No data recorded Hallucinations:Hallucinations: None  Ideas of Reference:None  Suicidal Thoughts:Suicidal Thoughts: No  Homicidal Thoughts:Homicidal Thoughts: No   Sensorium  Memory:Immediate Good; Recent Good; Remote Good  Judgment:Intact  Insight:Fair   Executive Functions  Concentration:Fair  Attention Span:Fair  [place] of Knowledge:Good  Language:Good   Psychomotor Activity  Psychomotor Activity:Psychomotor Activity: Decreased; Psychomotor Retardation   Assets  Assets:Communication Skills; Desire for Improvement   Sleep  Sleep:Sleep: Fair   Physical Exam: Physical Exam Psychiatric:        Attention and Perception: Attention and perception normal.        Mood and Affect: Mood is depressed.  Affect is tearful.        Speech: Speech normal.        Behavior: Behavior normal. Behavior is cooperative.        Thought Content: Thought content normal.        Cognition and Memory: Cognition and memory normal.        Judgment: Judgment normal.    Review of Systems  Psychiatric/Behavioral: Positive for depression.   Blood pressure (!) 111/58, pulse 62, temperature 100.2 F (37.9 C), temperature source Oral, resp. rate 15, height 5\' 6"  (1.676 m), weight 56.4 kg, SpO2 98 %. Body mass index is 20.08 kg/m.  Treatment Plan Summary: 60 year old female with history of depression and multiple medical problem who was admitted with hyperglycemia and syncope. She reports being depressed and stressed out due to multiple family issues but denies self harming thoughts.  Recommendations: -Consider Cymbalta 30 mg daily for depression -Add Trazodone 50 mg at bedtime as needed for sleep -Social worker to facilitate patient referral for outpatient psychiatrist and counseling after patient is medically stable.  Disposition: No evidence of imminent risk to self or others at present.   Patient does not meet criteria for psychiatric inpatient admission. Supportive therapy provided about ongoing stressors. Psychiatric service signing off. Re-consult as needed  Corena Pilgrim, MD 11/30/2020 7:03 PM

## 2020-11-30 NOTE — Progress Notes (Signed)
PROGRESS NOTE  [name] ATF:[id number] DOB: 09/03/1960 DOA: 11/28/2020 PCP: Biagio Borg, MD  HPI/Recap of past 24 hours: Jessica Lara is a 60 y.o. female with medical history significant of HTN, HLD. Presenting with possible syncopal episode and hyperglycemia. Patient is an unwilling historian. Multiple family members were contacted and were either unwilling to talk or did not know what happened. She was apparently found down by family this morning. They called EMS. Her glucose was checked at the scene by EMS and found to be 600. She was transported to the ED for help. Per ED PA note, patient was tearful during her interview, stating "I just want to end it all." She was otherwise uncooperative with interview.  She was started on insulin gtt and IV fluid for severe hyperglycemia. TRH, hospitalist team, was asked admit.  11/30/20: Patient was seen and examined at bedside.  When asked if she has had suicidal ideation she becomes tearful but does not provide an answer.  Assessment/Plan: Active Problems:   Hyperglycemia  Newly diagnosed diabetes, unspecified type, with hyperglycemia Not previously on hypoglycemic agents Hemoglobin A1c greater than 15.5% on 11/28/2020 Obtain C-peptide and GAD antibodies to help determine type of diabetes. She has been transitioned to long-acting and short acting subcu insulin Increased Lantus to 10 units twice daily, added NovoLog 5 units 3 times daily.  Continue insulin sliding scale. Diabetes coordinator for patient's education.  Possible syncope Found down on floor the morning of admission. UDS positive for THC on 11/28/2020. Positive orthostatic vital signs, continue IV fluid, and fall precautions.     - trp neg x 2, EKG sinus w/ some repol abnormalities     - EtOH neg, salicylates neg, LFTs neg     - no respiratory distress, no tachycardia     - neg CTH, CT c-spine, CXR     -2D echo pending at the time of this visit.  Chronic depression with  concern for high risk suicide When asked if she has thoughts about hurting herself she cries and states that nobody cares about her.  "I just want to end it all." She states she stopped taking her antidepressants for at least 6 months due to cost issues. Last QTc 461 on 11/28/2020, repeat EKG Continue one-to-one sitter for patient's own safety Psych consulted, appreciate assistance.  Essential hypertension, BPs are soft On oral antihypertensive prior to admission, continue to hold all. BPs have been soft despite IV fluid hydration. Continue to closely monitor vital signs and maintain MAP greater than 65.  Pseudohyponatremia in the setting of hyperglycemia. Corrected serum Na+ for glucose is ~140   DVT prophylaxis: Subcu Lovenox daily. Code Status: FULL  Family Communication:  On at bedside.  She states we could call her cousin.    Consults called:  Psychiatry.  Status is: Inpatient  Remains inpatient appropriate because:Inpatient level of care appropriate due to severity of illness    Status is: Inpatient    Dispo: The patient is from: Home.              Anticipated d/c is to: Undetermined.               Patient currently not safe for discharge due to ongoing management of likely newly diagnosed diabetes, unspecified type.   Difficult to place patient, not applicable.        Objective: Vitals:   11/30/20 0500 11/30/20 0600 11/30/20 0800 11/30/20 1200  BP: (!) 136/52 (!) 111/58  Pulse:  62    Resp: 16 15    Temp:   97.9 F (36.6 C) 98.7 F (37.1 C)  TempSrc:   Oral Oral  SpO2:  98%    Weight:      Height:        Intake/Output Summary (Last 24 hours) at 11/30/2020 1231 Last data filed at 11/30/2020 1230 Gross per 24 hour  Intake 1463 ml  Output 2900 ml  Net -1437 ml   Filed Weights   11/28/20 1800 11/29/20 0500  Weight: 55.3 kg 56.4 kg    Exam:  . General: 60 y.o. year-old female 5.  No acute distress.  Alert oriented x3.  Tearful at times.    . Cardiovascular: Regular rate and rhythm no rubs or gallops.   Marland Kitchen Respiratory: Clear to auscultation with no wheezes or rales.  Good inspiratory effort. . Abdomen: Soft nontender.  Normal bowel sounds present.  Musculoskeletal: No lower extremity edema bilaterally. . Skin: No ulcerative lesions noted.   Marland Kitchen Psychiatry: Mood is appropriate for condition and setting.   Data Reviewed: CBC: Recent Labs  Lab 11/28/20 1040 11/29/20 0241 11/30/20 0114 11/30/20 0529  WBC 6.7 6.7 6.2 5.2  HGB 12.4 11.2* 10.3* 10.4*  HCT 35.3* 32.9* 30.0* 31.0*  MCV 85.3 87.0 88.5 91.4  PLT 276 244 235 993   Basic Metabolic Panel: Recent Labs  Lab 11/29/20 0241 11/29/20 0723 11/29/20 1052 11/30/20 0114 11/30/20 0529  NA 138 135 134* 134* 135  K 2.9* 4.2 4.1 3.9 3.9  CL 104 104 100 105 107  CO2 26 21* 22 23 21*  GLUCOSE 156* 193* 311* 256* 283*  BUN 10 8 13 16 13   CREATININE 0.66 0.79 1.00 0.68 0.46  CALCIUM 9.6 9.6 9.6 9.1 9.1  MG 2.0  --   --  2.1 2.0  PHOS  --   --   --  4.2 4.2   GFR: Estimated Creatinine Clearance: 67.4 mL/min (by C-G formula based on SCr of 0.46 mg/dL). Liver Function Tests: Recent Labs  Lab 11/28/20 1108 11/30/20 0114  AST 20 21  ALT 20 11  ALKPHOS 79 73  BILITOT 0.3 0.3  PROT 8.3* 6.1*  ALBUMIN 4.2 3.4*   No results for input(s): LIPASE, AMYLASE in the last 168 hours. No results for input(s): AMMONIA in the last 168 hours. Coagulation Profile: No results for input(s): INR, PROTIME in the last 168 hours. Cardiac Enzymes: No results for input(s): CKTOTAL, CKMB, CKMBINDEX, TROPONINI in the last 168 hours. BNP (last 3 results) No results for input(s): PROBNP in the last 8760 hours. HbA1C: Recent Labs    11/28/20 1040  HGBA1C >15.5*   CBG: Recent Labs  Lab 11/29/20 1641 11/29/20 1945 11/29/20 2116 11/30/20 0801 11/30/20 1152  GLUCAP 301* 320* 292* 324* 256*   Lipid Profile: Recent Labs    11/30/20 0114  CHOL 218*  HDL 24*  LDLCALC UNABLE TO  CALCULATE IF TRIGLYCERIDE OVER 400 mg/dL  TRIG 862*  CHOLHDL 9.1   Thyroid Function Tests: No results for input(s): TSH, T4TOTAL, FREET4, T3FREE, THYROIDAB in the last 72 hours. Anemia Panel: No results for input(s): VITAMINB12, FOLATE, FERRITIN, TIBC, IRON, RETICCTPCT in the last 72 hours. Urine analysis:    Component Value Date/Time   COLORURINE COLORLESS (A) 11/28/2020 1040   APPEARANCEUR CLEAR 11/28/2020 1040   LABSPEC 1.018 11/28/2020 1040   PHURINE 7.0 11/28/2020 1040   GLUCOSEU >=500 (A) 11/28/2020 1040   GLUCOSEU NEGATIVE 04/10/2019 1549   HGBUR NEGATIVE  11/28/2020 [place] 11/28/2020 [place] 11/28/2020 1040   PROTEINUR NEGATIVE 11/28/2020 1040   UROBILINOGEN 0.2 04/10/2019 1549   NITRITE NEGATIVE 11/28/2020 1040   LEUKOCYTESUR NEGATIVE 11/28/2020 1040   Sepsis Labs: @LABRCNTIP (procalcitonin:4,lacticidven:4)  ) Recent Results (from the past 240 hour(s))  Resp Panel by RT-PCR (Flu A&B, Covid) Nasopharyngeal Swab     Status: None   Collection Time: 11/28/20  1:45 PM   Specimen: Nasopharyngeal Swab; Nasopharyngeal(NP) swabs in vial transport medium  Result Value Ref Range Status   SARS Coronavirus 2 by RT PCR NEGATIVE NEGATIVE Final    Comment: (NOTE) SARS-CoV-2 target nucleic acids are NOT DETECTED.  The SARS-CoV-2 RNA is generally detectable in upper respiratory specimens during the acute phase of infection. The lowest concentration of SARS-CoV-2 viral copies this assay can detect is 138 copies/mL. A negative result does not preclude SARS-Cov-2 infection and should not be used as the sole basis for treatment or other patient management decisions. A negative result may occur with  improper specimen collection/handling, submission of specimen other than nasopharyngeal swab, presence of viral mutation(s) within the areas targeted by this assay, and inadequate number of viral copies(<138 copies/mL). A negative result must be  combined with clinical observations, patient history, and epidemiological information. The expected result is Negative.  Fact Sheet for Patients:  EntrepreneurPulse.com.au  Fact Sheet for Healthcare Providers:  IncredibleEmployment.be  This test is no t yet approved or cleared by the [place] FDA and  has been authorized for detection and/or diagnosis of SARS-CoV-2 by FDA under an Emergency Use Authorization (EUA). This EUA will remain  in effect (meaning this test can be used) for the duration of the COVID-19 declaration under Section 564(b)(1) of the Act, 21 U.S.C.section 360bbb-3(b)(1), unless the authorization is terminated  or revoked sooner.       Influenza A by PCR NEGATIVE NEGATIVE Final   Influenza B by PCR NEGATIVE NEGATIVE Final    Comment: (NOTE) The Xpert Xpress SARS-CoV-2/FLU/RSV plus assay is intended as an aid in the diagnosis of influenza from Nasopharyngeal swab specimens and should not be used as a sole basis for treatment. Nasal washings and aspirates are unacceptable for Xpert Xpress SARS-CoV-2/FLU/RSV testing.  Fact Sheet for Patients: EntrepreneurPulse.com.au  Fact Sheet for Healthcare Providers: IncredibleEmployment.be  This test is not yet approved or cleared by the [place] FDA and has been authorized for detection and/or diagnosis of SARS-CoV-2 by FDA under an Emergency Use Authorization (EUA). This EUA will remain in effect (meaning this test can be used) for the duration of the COVID-19 declaration under Section 564(b)(1) of the Act, 21 U.S.C. section 360bbb-3(b)(1), unless the authorization is terminated or revoked.  Performed at [facility], [place] [address] [place], [place] [address]   MRSA PCR Screening     Status: None   Collection Time: 11/28/20  4:47 PM   Specimen: Nasal Mucosa; Nasopharyngeal  Result Value Ref Range Status   MRSA by  PCR NEGATIVE NEGATIVE Final    Comment:        The GeneXpert MRSA Assay (FDA approved for NASAL specimens only), is one component of a comprehensive MRSA colonization surveillance program. It is not intended to diagnose MRSA infection nor to guide or monitor treatment for MRSA infections. Performed at [facility], [place] [address] [place], [place] [address]       Studies: No results found.  Scheduled Meds: . atorvastatin  40 mg Oral Daily  .  Chlorhexidine Gluconate Cloth  6 each Topical Q0600  . enoxaparin (LOVENOX) injection  40 mg Subcutaneous Q24H  . feeding supplement (GLUCERNA SHAKE)  237 mL Oral BID BM  . insulin aspart  0-20 Units Subcutaneous TID WC  . insulin aspart  0-5 Units Subcutaneous QHS  . insulin aspart  4 Units Subcutaneous TID WC  . insulin glargine  8 Units Subcutaneous BID  . multivitamin with minerals  1 tablet Oral Daily  . nicotine  21 mg Transdermal Daily  . sodium chloride flush  3 mL Intravenous Q12H    Continuous Infusions: . lactated ringers 75 mL/hr at 11/30/20 0334     LOS: 2 days     Kayleen Memos, MD [facility] Pager [phone]  If 7PM-7AM, please contact night-coverage [email or web address] Password [facility] 11/30/2020, 12:31 PM

## 2020-11-30 NOTE — Progress Notes (Signed)
Chaplain responded to request from RN and patient for spiritual and psychological support.  RN indicated patient had some suicidal ideations.  Patient was accepting of the visit.  Chaplain sat bedside.  Patient began with sharing about strained relationship with her younger sister who apparently hung up on her today and also that her daughter has not spoken to her in 2 years and has a history of this pattern.  Patient's cousin has come to visit today and patient says they are best friends.  Between the medical changes and the family relationships the patient says she wants the pain to be over and that she is so tired.  Chaplain explored what she meant by wanting pain to be over and she said, I don't want to hurt myself, I just want to be better. Patient does live alone.  Patient unsure why she came to the hospital except that she collapsed.  Patient has a strong [place] faith and we discussed some spiritual practices that can help find some center and not feel so overwhelmed.  Patient got quiet and said I don't want to talk anymore today.  Patient open to visit tomorrow and chaplain will return.  Chaplain connected with RN following the visit.  Chaplain available as needed. [place], [place].    11/30/20 1347  Clinical Encounter Type  Visited With Patient;Health care provider  Visit Type Initial;Psychological support;Spiritual support  Referral From Nurse  Consult/Referral To Chaplain  Spiritual Encounters  Spiritual Needs Prayer;Emotional  Stress Factors  Patient Stress Factors Family relationships;Health changes  .  Marland Kitchen

## 2020-12-01 DIAGNOSIS — E119 Type 2 diabetes mellitus without complications: Secondary | ICD-10-CM

## 2020-12-01 DIAGNOSIS — E1100 Type 2 diabetes mellitus with hyperosmolarity without nonketotic hyperglycemic-hyperosmolar coma (NKHHC): Secondary | ICD-10-CM

## 2020-12-01 LAB — GLUCOSE, CAPILLARY
Glucose-Capillary: 254 mg/dL — ABNORMAL HIGH (ref 70–99)
Glucose-Capillary: 215 mg/dL — ABNORMAL HIGH (ref 70–99)
Glucose-Capillary: 105 mg/dL — ABNORMAL HIGH (ref 70–99)
Glucose-Capillary: 188 mg/dL — ABNORMAL HIGH (ref 70–99)

## 2020-12-01 MED ORDER — METFORMIN HCL 500 MG PO TABS
500.0000 mg | ORAL_TABLET | Freq: Two times a day (BID) | ORAL | Status: DC
  Administered 2020-12-01 – 2020-12-02 (×3): 500 mg via ORAL
  Filled 2020-12-01 (×3): qty 1

## 2020-12-01 MED ORDER — GLIMEPIRIDE 2 MG PO TABS
2.0000 mg | ORAL_TABLET | Freq: Every day | ORAL | Status: DC
  Administered 2020-12-01 – 2020-12-02 (×2): 2 mg via ORAL
  Filled 2020-12-01 (×2): qty 1

## 2020-12-01 NOTE — Progress Notes (Addendum)
PROGRESS NOTE    [name]  VQM:[id number] DOB: 01-Jun-1961 DOA: 11/28/2020 PCP: Biagio Borg, MD    Brief Narrative:  Jessica Lara was admitted to the hospital working diagnosis of hyperglycemia in the setting of syncope episode, hyperosmolar non ketotic state.  60 year old female past medical history for hypertension and dyslipidemia who presented after experiencing syncope episode.  Apparently patient was found down by family, EMS was called, and her glucose was 600.  She was transported to the hospital for further evaluation.  On her initial physical examination blood pressure 151/80, heart rate 92, respiratory 27, oxygen saturation 98%, her lungs are clear to auscultation bilaterally, heart S1-S2, present, rhythmic, soft abdomen, no lower extremity edema, patient was awake and alert.  Sodium 126, potassium 4.4, chloride 88, bicarb 26, glucose 809, BUN 19, creatinine 1.0.  Anion gap 12. White count 6.7, hemoglobin 12.4, hematocrit 35.3, platelets 276. SARS COVID-19 negative.  Urinalysis specific gravity 1.018, > 500 glucose, 6-10 white cells. Toxicology screen positive for THC.  CT head, cervical spine, no acute changes. Chest radiograph negative for infiltrates.  EKG 100 bpm, normal axis, normal intervals, sinus rhythm with poor R wave progression, absolute ST elevation in V2-V3, no significant T wave changes.  Patient was admitted to the stepdown unit, she was placed on intravenous insulin for glucose control.  Psychiatry was consulted for depression, possible suicidal.  Recommendations to start Cymbalta for depression and add trazodone as needed for sleep.  No evidence of imminent risk for self or others.  Assessment & Plan:   Principal Problem:   Hyperosmolar hyperglycemic state (HHS) ([place]) Active Problems:   ANXIETY DEPRESSION   Hyperglycemia   Essential hypertension   Major depressive disorder, recurrent episode, moderate (HCC)   T2DM (type 2 diabetes mellitus)  ([place])   1. T2DM with hyperglycemic non ketotic state (syncope). Patient is tolerating po well, no nausea or vomiting.  Fasting glucose is 283, capillary 154, 224, and 254.  Hgb A1c >15,5.   Plan to transition to oral antihyperglycemic agents, in preparation for possible discharge home in 24 hrs. Start metformin and glimepiride.  Hold on basal insulin for now but continue with insulin sliding scale for glucose cover and monitoring.   Ok to transfer to the medical ward.  Consult PT and PT, out of bed to chair tid with meals.   2. HTN/ dyslipidemia. Blood pressure is 103/41 mmHg this am, continue to hold on antihypertensive medications to prevent hypotension.   Continue with atorvastatin,.   3. Chronic depression. No suicidal risk. OK to discontinue one to one sitter. Patient has family support at home.  On hydroxyzine, duloxetine and trazodone per psych recommendations.    Status is: Inpatient  Remains inpatient appropriate because:Inpatient level of care appropriate due to severity of illness   Dispo:  Patient From: Home  Planned Disposition: To be determined  Medically stable for discharge: No    DVT prophylaxis: Enoxaparin   Code Status:   full  Family Communication:  No family at the bedside      Nutrition Status: Nutrition Problem: Inadequate oral intake Etiology: other (see comment) (lack of interest in eating PTA) Signs/Symptoms: per patient/family report Interventions: Glucerna shake,MVI      Consultants:   Psychiatry    Subjective: Patient is feeling better, but not yet back to baseline, no nausea or vomiting, no chest pain or dyspnea. Ambulating in the hallway,.   Objective: Vitals:   12/01/20 0400 12/01/20 0500 12/01/20 0502 12/01/20 0600  BP: Marland Kitchen)  94/32  (!) 125/55 (!) 103/41  Pulse:  63    Resp: 16 19 18 16   Temp: 98.6 F (37 C)     TempSrc: Oral     SpO2: 99% 98% 98% 99%  Weight:    61.1 kg  Height:        Intake/Output Summary (Last  24 hours) at 12/01/2020 0823 Last data filed at 12/01/2020 0645 Gross per 24 hour  Intake 1278 ml  Output 2850 ml  Net -1572 ml   Filed Weights   11/28/20 1800 11/29/20 0500 12/01/20 0600  Weight: 55.3 kg 56.4 kg 61.1 kg    Examination:   General: Not in pain or dyspnea. Deconditioned  Neurology: Awake and alert, non focal  E ENT: no pallor, no icterus, oral mucosa moist Cardiovascular: No JVD. S1-S2 present, rhythmic, no gallops, rubs, or murmurs. No lower extremity edema. Pulmonary: positive breath sounds bilaterally, adequate air movement, no wheezing, rhonchi or rales. Gastrointestinal. Abdomen soft and non tender Skin. No rashes Musculoskeletal: no joint deformities     Data Reviewed: I have personally reviewed following labs and imaging studies  CBC: Recent Labs  Lab 11/28/20 1040 11/29/20 0241 11/30/20 0114 11/30/20 0529  WBC 6.7 6.7 6.2 5.2  HGB 12.4 11.2* 10.3* 10.4*  HCT 35.3* 32.9* 30.0* 31.0*  MCV 85.3 87.0 88.5 91.4  PLT 276 244 235 580   Basic Metabolic Panel: Recent Labs  Lab 11/29/20 0241 11/29/20 0723 11/29/20 1052 11/30/20 0114 11/30/20 0529  NA 138 135 134* 134* 135  K 2.9* 4.2 4.1 3.9 3.9  CL 104 104 100 105 107  CO2 26 21* 22 23 21*  GLUCOSE 156* 193* 311* 256* 283*  BUN 10 8 13 16 13   CREATININE 0.66 0.79 1.00 0.68 0.46  CALCIUM 9.6 9.6 9.6 9.1 9.1  MG 2.0  --   --  2.1 2.0  PHOS  --   --   --  4.2 4.2   GFR: Estimated Creatinine Clearance: 70.9 mL/min (by C-G formula based on SCr of 0.46 mg/dL). Liver Function Tests: Recent Labs  Lab 11/28/20 1108 11/30/20 0114  AST 20 21  ALT 20 11  ALKPHOS 79 73  BILITOT 0.3 0.3  PROT 8.3* 6.1*  ALBUMIN 4.2 3.4*   No results for input(s): LIPASE, AMYLASE in the last 168 hours. No results for input(s): AMMONIA in the last 168 hours. Coagulation Profile: No results for input(s): INR, PROTIME in the last 168 hours. Cardiac Enzymes: No results for input(s): CKTOTAL, CKMB, CKMBINDEX,  TROPONINI in the last 168 hours. BNP (last 3 results) No results for input(s): PROBNP in the last 8760 hours. HbA1C: Recent Labs    11/28/20 1040  HGBA1C >15.5*   CBG: Recent Labs  Lab 11/30/20 0801 11/30/20 1152 11/30/20 1636 11/30/20 2126 12/01/20 0733  GLUCAP 324* 256* 154* 224* 254*   Lipid Profile: Recent Labs    11/30/20 0114  CHOL 218*  HDL 24*  LDLCALC UNABLE TO CALCULATE IF TRIGLYCERIDE OVER 400 mg/dL  TRIG 862*  CHOLHDL 9.1  LDLDIRECT 86.1   Thyroid Function Tests: No results for input(s): TSH, T4TOTAL, FREET4, T3FREE, THYROIDAB in the last 72 hours. Anemia Panel: No results for input(s): VITAMINB12, FOLATE, FERRITIN, TIBC, IRON, RETICCTPCT in the last 72 hours.    Radiology Studies: I have reviewed all of the imaging during this hospital visit personally     Scheduled Meds: . atorvastatin  40 mg Oral Daily  . Chlorhexidine Gluconate Cloth  6 each Topical Q0600  .  DULoxetine  30 mg Oral Daily  . enoxaparin (LOVENOX) injection  40 mg Subcutaneous Q24H  . feeding supplement (GLUCERNA SHAKE)  237 mL Oral BID BM  . insulin aspart  0-20 Units Subcutaneous TID WC  . insulin aspart  0-5 Units Subcutaneous QHS  . insulin aspart  5 Units Subcutaneous TID WC  . insulin glargine  10 Units Subcutaneous BID  . multivitamin with minerals  1 tablet Oral Daily  . nicotine  21 mg Transdermal Daily  . sodium chloride flush  3 mL Intravenous Q12H   Continuous Infusions:   LOS: 3 days        Lyndon Chenoweth Gerome Apley, MD

## 2020-12-01 NOTE — Progress Notes (Signed)
Inpatient Diabetes Program Recommendations  AACE/ADA: New Consensus Statement on Inpatient Glycemic Control (2015)  Target Ranges:  Prepandial:   less than 140 mg/dL      Peak postprandial:   less than 180 mg/dL (1-2 hours)      Critically ill patients:  140 - 180 mg/dL   Lab Results  Component Value Date   GLUCAP 254 (H) 12/01/2020   HGBA1C >15.5 (H) 11/28/2020    Review of Glycemic Control  Diabetes history: New onset DM2 Outpatient Diabetes medications: None Current orders for Inpatient glycemic control: Novolog 0-20 units TID with meals and 0-5 HS, metformin 500 mg BID, Amaryl 2 mg QAM  Received Lantus 10 units this am, then order was d/ced. Received 16 units of Novolog this am for CBG of 254 mg/dL.  On 5/28 - received 45 units of Novolog and 18 units of Lantus.  HgbA1C of > 15.5%, which means average daily blood sugars have been > 400 over past 2-3 months.   Inpatient Diabetes Program Recommendations:     May be difficult to achieve glycemic control goals with OHAs alone. If pt is to go home with insulin, would recommend: Novolin ReliOn 70/30 Flexpen 15 units BID.  Spoke to pt by phone this morning and she feels confident in checking blood sugars and giving her own insulin, if needed. Answered questions. Pt will follow-up at [facility] for diabetes management.   Thank you. Lorenda Peck, RD, LDN, CDE Inpatient Diabetes Coordinator [phone]

## 2020-12-01 NOTE — Progress Notes (Signed)
At request of patient from yesterday, chaplain followed up.  Patient was sitting up in her chair eating lunch and seemed much improved from yesterday.  Patient reflected on reading scripture and that she believes God has brought her a long way and she says she has a long way to go.  Patient wants to work on not being negatively affected by some of her family but lean on those who want to help.  Patient received a call from her pastor who prayed with her on the phone. Patient said I have a lot to live for and believes in a good future ahead.   Chaplin offered words of encouragement and prayer.  Will have day chaplain follow up tomorrow. [place], [place].    12/01/20 1246  Clinical Encounter Type  Visited With Patient  Visit Type Follow-up  Referral From Patient  Consult/Referral To Chaplain  Spiritual Encounters  Spiritual Needs Prayer

## 2020-12-01 NOTE — Evaluation (Signed)
Physical Therapy Evaluation Patient Details Name: Jessica Lara MRN: [id number] DOB: 1961-03-09 Today's Date: 12/01/2020   History of Present Illness  60 year old female admitted to the hospital with diagnosis of hyperglycemia in the setting of syncope episode, hyperosmolar non ketotic state.    past medical history: hypertension and dyslipidemia who presented after experiencing syncope episode.  Apparently patient was found down by family, EMS was called, and her glucose was 600. CT head, cervical spine, no acute changes.  Clinical Impression  Patient evaluated by Physical Therapy with no further acute PT needs identified. All education has been completed and the patient has no further questions. Pt amb 300' I'ly, no LOB, steady gait. See below for further details. See below for any follow-up Physical Therapy or equipment needs. PT is signing off. Thank you for this referral.     Follow Up Recommendations No PT follow up    Equipment Recommendations  None recommended by PT    Recommendations for Other Services       Precautions / Restrictions Precautions Precautions: None Restrictions Weight Bearing Restrictions: No      Mobility  Bed Mobility Overal bed mobility: Independent             General bed mobility comments: able to get into bed knee first while standing on opposite leg and then turn to supine without difficulty    Transfers Overall transfer level: Independent                  Ambulation/Gait Ambulation/Gait assistance: Independent Gait Distance (Feet): 300 Feet   Gait Pattern/deviations: WFL(Within Functional Limits) Gait velocity: WFL   General Gait Details: no LOB, steady gait without device  Stairs            Wheelchair Mobility    Modified Rankin (Stroke Patients Only)       Balance Overall balance assessment: Needs assistance;Independent   Sitting balance-Leahy Scale: Normal                       High level  balance activites: Backward walking;Turns;Head turns High Level Balance Comments: no LOB with above             Pertinent Vitals/Pain Pain Assessment: No/denies pain    Home Living Family/patient expects to be discharged to:: Private residence Living Arrangements: Alone     Home Access: Stairs to enter   Technical brewer of Steps: 2 Home Layout: One level Home Equipment: None      Prior Function Level of Independence: Independent               Hand Dominance        Extremity/Trunk Assessment   Upper Extremity Assessment Upper Extremity Assessment: Overall WFL for tasks assessed    Lower Extremity Assessment Lower Extremity Assessment: Overall WFL for tasks assessed       Communication   Communication: No difficulties  Cognition Arousal/Alertness: Awake/alert Behavior During Therapy: WFL for tasks assessed/performed Overall Cognitive Status: Within Functional Limits for tasks assessed                                        General Comments      Exercises     Assessment/Plan    PT Assessment Patent does not need any further PT services  PT Problem List         PT Treatment Interventions  PT Goals (Current goals can be found in the Care Plan section)  Acute Rehab PT Goals Patient Stated Goal: home soon! PT Goal Formulation: All assessment and education complete, DC therapy    Frequency     Barriers to discharge        Co-evaluation               AM-PAC PT "6 Clicks" Mobility  Outcome Measure Help needed turning from your back to your side while in a flat bed without using bedrails?: None Help needed moving from lying on your back to sitting on the side of a flat bed without using bedrails?: None Help needed moving to and from a bed to a chair (including a wheelchair)?: None Help needed standing up from a chair using your arms (e.g., wheelchair or bedside chair)?: None Help needed to walk in hospital  room?: None Help needed climbing 3-5 steps with a railing? : None 6 Click Score: 24    End of Session   Activity Tolerance: Patient tolerated treatment well Patient left: with call bell/phone within reach;in bed;with bed alarm set   PT Visit Diagnosis: Other abnormalities of gait and mobility (R26.89)    Time: 1610-9604 PT Time Calculation (min) (ACUTE ONLY): 12 min   Charges:   PT Evaluation $PT Eval Low Complexity: [place], PT  Acute Rehab Dept ([place]) [phone] Pager [phone]  12/01/2020   [facility] 12/01/2020, 2:59 PM

## 2020-12-02 ENCOUNTER — Other Ambulatory Visit: Payer: Self-pay | Admitting: Internal Medicine

## 2020-12-02 DIAGNOSIS — I10 Essential (primary) hypertension: Secondary | ICD-10-CM

## 2020-12-02 DIAGNOSIS — E1165 Type 2 diabetes mellitus with hyperglycemia: Secondary | ICD-10-CM

## 2020-12-02 DIAGNOSIS — F341 Dysthymic disorder: Secondary | ICD-10-CM

## 2020-12-02 DIAGNOSIS — E1100 Type 2 diabetes mellitus with hyperosmolarity without nonketotic hyperglycemic-hyperosmolar coma (NKHHC): Principal | ICD-10-CM

## 2020-12-02 LAB — GLUCOSE, CAPILLARY
Glucose-Capillary: 182 mg/dL — ABNORMAL HIGH (ref 70–99)
Glucose-Capillary: 198 mg/dL — ABNORMAL HIGH (ref 70–99)

## 2020-12-02 LAB — BASIC METABOLIC PANEL
Anion gap: 6 (ref 5–15)
BUN: 21 mg/dL — ABNORMAL HIGH (ref 6–20)
CO2: 23 mmol/L (ref 22–32)
Calcium: 9.1 mg/dL (ref 8.9–10.3)
Chloride: 108 mmol/L (ref 98–111)
Creatinine, Ser: 0.64 mg/dL (ref 0.44–1.00)
GFR, Estimated: >60 mL/min (ref >60)
Glucose, Bld: 200 mg/dL — ABNORMAL HIGH (ref 70–99)
Potassium: 4.1 mmol/L (ref 3.5–5.1)
Sodium: 137 mmol/L (ref 135–145)

## 2020-12-02 MED ORDER — GLIMEPIRIDE 2 MG PO TABS: 2.0000 mg | ORAL_TABLET | Freq: Every day | ORAL | 0 refills | Status: AC

## 2020-12-02 MED ORDER — DULOXETINE HCL 30 MG PO CPEP: 30.0000 mg | ORAL_CAPSULE | Freq: Every day | ORAL | 0 refills | Status: AC

## 2020-12-02 MED ORDER — TRAZODONE HCL 50 MG PO TABS: 50.0000 mg | ORAL_TABLET | Freq: Every evening | ORAL | 0 refills | Status: AC | PRN

## 2020-12-02 MED ORDER — METFORMIN HCL 500 MG PO TABS: 500.0000 mg | ORAL_TABLET | Freq: Two times a day (BID) | ORAL | 0 refills | Status: AC

## 2020-12-02 MED ORDER — ATORVASTATIN CALCIUM 40 MG PO TABS: 40.0000 mg | ORAL_TABLET | Freq: Every day | ORAL | 0 refills | Status: AC

## 2020-12-02 MED ORDER — BLOOD GLUCOSE METER KIT: PACK | 0 refills | Status: AC

## 2020-12-02 MED ORDER — OLMESARTAN MEDOXOMIL-HCTZ 40-25 MG PO TABS: 1.0000 | ORAL_TABLET | Freq: Every day | ORAL | 0 refills | Status: DC

## 2020-12-02 NOTE — Discharge Summary (Addendum)
Physician Discharge Summary  Jessica Lara YBW:[id number] DOB: [birth year] DOA: 11/28/2020  PCP: Biagio Borg, MD  Admit date: 11/28/2020 Discharge date: 12/02/2020  Admitted From: Home  Disposition:  Home   Recommendations for Outpatient Follow-up and new medication changes:  1. Follow up with Dr, Jenny Reichmann in 7 to 10 days.  2. Patient has been placed on metformin and glimepiride for glucose control.  3. Continue glucose monitoring at home, three time a day before meals.   Home Health: no   Equipment/Devices: glucometer    Discharge Condition: stable  CODE STATUS: full  Diet recommendation:  Heart healthy and diabetic prudent.   Brief/Interim Summary: Jessica Lara was admitted to the hospital with the working diagnosis of hyperglycemia in the setting of syncope episode, hyperosmolar non ketotic state.  60 year old female past medical history for hypertension and dyslipidemia who presented after experiencing syncope episode.  Apparently patient was found down by her family, EMS was called, and her glucose was 600.  She was transported to the hospital for further evaluation.  On her initial physical examination blood pressure 151/80, heart rate 92, respiratory rate 27, oxygen saturation 98%, her lungs were clear to auscultation bilaterally, heart S1-S2, present, rhythmic, soft abdomen, no lower extremity edema, patient was awake and alert.  Sodium 126, potassium 4.4, chloride 88, bicarb 26, glucose 809, BUN 19, creatinine 1.0.  Anion gap 12. White count 6.7, hemoglobin 12.4, hematocrit 35.3, platelets 276. SARS COVID-19 negative.  Urinalysis specific gravity 1.018, > 500 glucose, 6-10 white cells. Toxicology screen positive for THC.  CT head, cervical spine, no acute changes. Chest radiograph negative for infiltrates.  EKG 100 bpm, normal axis, normal intervals, sinus rhythm with poor R wave progression, up slope ST elevation in V2-V3, no significant T wave changes.  Patient was  admitted to the stepdown unit, she was placed on intravenous insulin for glucose control along with IV fluids.   Psychiatry was consulted for depression, possible suicidal.  Recommendations to start Cymbalta for depression and add trazodone as needed for sleep.  No evidence of imminent risk for self or others.  She was transitioned to SQ insulin and oral hypoglycemic agents were added with good toleration.  Plan for follow up as outpatient.   1.  Newly diagnosed type 2 diabetes mellitus, hyperglycemic nonketotic state. (Syncope).  Patient admitted to the stepdown unit, with intravenous insulin along with intravenous fluids her serum glucose improved.  Her diet was advanced with good toleration.  Initially transition to subcutaneous insulin and then added oral hypoglycemic agents.  Hemoglobin A1c greater than 15.5.  Patient received diabetic teaching, at discharge will continue metformin and glimepiride. Continue glucose monitor at home, follow-up with Dr. Jenny Reichmann in 7 to 10 days.  2.  Hypertension/dyslipidemia.  Her antihypertensive agents were held during her hospitalization to prevent hypotension. At discharge will resume olmesartan- hydrochlorothiazide.   Continue atorvastatin.  3.  Chronic depression.  Psychiatry was consulted, recommendations to continue duloxetine and trazodone.  No indication of danger to herself or others.  4. Hypokalemia. K corrected with KCL, renal function remained stable, at discharge K 4,1 with serum bicarbonate 23, Na 137 and Cl 108.   Discharge Diagnoses:  Principal Problem:   Hyperosmolar hyperglycemic state (HHS) ([place]) Active Problems:   ANXIETY DEPRESSION   Hyperglycemia   Essential hypertension   Major depressive disorder, recurrent episode, moderate (HCC)   T2DM (type 2 diabetes mellitus) ([place])    Discharge Instructions   Allergies as of 12/02/2020  Reactions   Lisinopril    cough      Medication List    STOP taking these medications    gabapentin 100 MG capsule Commonly known as: NEURONTIN   hydrOXYzine 25 MG tablet Commonly known as: ATARAX/VISTARIL   icosapent Ethyl 1 g capsule Commonly known as: VASCEPA   naproxen sodium 220 MG tablet Commonly known as: ALEVE   venlafaxine 37.5 MG tablet Commonly known as: EFFEXOR   venlafaxine XR 150 MG 24 hr capsule Commonly known as: EFFEXOR-XR   Vitamin D (Ergocalciferol) 1.25 MG (50000 UNIT) Caps capsule Commonly known as: DRISDOL     TAKE these medications   atorvastatin 40 MG tablet Commonly known as: LIPITOR Take 1 tablet (40 mg total) by mouth daily.   blood glucose meter kit and supplies Dispense based on patient and insurance preference. Use up to four times daily as directed. (FOR ICD-10 E10.9, E11.9).   DULoxetine 30 MG capsule Commonly known as: CYMBALTA Take 1 capsule (30 mg total) by mouth daily.   fluticasone 50 MCG/ACT nasal spray Commonly known as: FLONASE Place 1-2 sprays into both nostrils daily as needed for allergies or rhinitis.   glimepiride 2 MG tablet Commonly known as: AMARYL Take 1 tablet (2 mg total) by mouth daily with breakfast. Start taking on: Dec 03, 2020   metFORMIN 500 MG tablet Commonly known as: GLUCOPHAGE Take 1 tablet (500 mg total) by mouth 2 (two) times daily with a meal.   multivitamin with minerals Tabs tablet Take 1 tablet by mouth daily.   olmesartan-hydrochlorothiazide 40-25 MG tablet Commonly known as: BENICAR HCT Take 1 tablet by mouth daily.   traZODone 50 MG tablet Commonly known as: DESYREL Take 1 tablet (50 mg total) by mouth at bedtime as needed for sleep.       Follow-up Information    Biagio Borg, MD Follow up in 1 week(s).   Specialties: Internal Medicine, Radiology Contact information: [place] [address] [phone]              Allergies  Allergen Reactions  . Lisinopril     cough    Consultations:  Psychiatry    Procedures/Studies: CT Head  Wo Contrast  Result Date: 11/28/2020 CLINICAL DATA:  Neck trauma, intoxicated or up tended. Syncope, simple, normal neuro exam. EXAM: CT HEAD WITHOUT CONTRAST CT CERVICAL SPINE WITHOUT CONTRAST TECHNIQUE: Multidetector CT imaging of the head and cervical spine was performed following the standard protocol without intravenous contrast. Multiplanar CT image reconstructions of the cervical spine were also generated. COMPARISON:  No pertinent prior exams available for comparison. FINDINGS: CT HEAD FINDINGS Brain: Cerebral volume is normal. There is no acute intracranial hemorrhage. No demarcated cortical infarct. No extra-axial fluid collection. No evidence of intracranial mass. No midline shift. Vascular: No hyperdense vessel.  Atherosclerotic calcifications. Skull: Normal. Negative for fracture or focal lesion. Sinuses/Orbits: Visualized orbits show no acute finding. Trace bilateral ethmoid and right maxillary sinus mucosal thickening at the imaged levels. CT CERVICAL SPINE FINDINGS Alignment: Cervical levocurvature. Straightening of the expected cervical lordosis. No significant spondylolisthesis. Skull base and vertebrae: The basion-dental and atlanto-dental intervals are maintained.No evidence of acute fracture to the cervical spine. Soft tissues and spinal canal: No prevertebral fluid or swelling. No visible canal hematoma. Disc levels: Cervical spondylosis. Most notably at C5-C6, there is moderate disc degeneration, a disc bulge, a superimposed central disc protrusion, endplate spurring and uncovertebral hypertrophy. Bilateral neural foraminal narrowing and apparent moderate spinal canal stenosis at  this level. Upper chest: No consolidation within the imaged lung apices. No visible pneumothorax. IMPRESSION: CT head: 1. Unremarkable non-contrast CT appearance of the brain. No evidence of acute intracranial abnormality. 2. Mild paranasal sinus mucosal thickening. CT cervical spine: 1. No evidence of acute  fracture to the cervical spine. 2. Nonspecific straightening of the expected cervical lordosis. 3. Cervical levocurvature. 4. Cervical spondylosis, as described and greatest at C5-C6. Electronically Signed   By: Kellie Simmering DO   On: 11/28/2020 12:29   CT Cervical Spine Wo Contrast  Result Date: 11/28/2020 CLINICAL DATA:  Neck trauma, intoxicated or up tended. Syncope, simple, normal neuro exam. EXAM: CT HEAD WITHOUT CONTRAST CT CERVICAL SPINE WITHOUT CONTRAST TECHNIQUE: Multidetector CT imaging of the head and cervical spine was performed following the standard protocol without intravenous contrast. Multiplanar CT image reconstructions of the cervical spine were also generated. COMPARISON:  No pertinent prior exams available for comparison. FINDINGS: CT HEAD FINDINGS Brain: Cerebral volume is normal. There is no acute intracranial hemorrhage. No demarcated cortical infarct. No extra-axial fluid collection. No evidence of intracranial mass. No midline shift. Vascular: No hyperdense vessel.  Atherosclerotic calcifications. Skull: Normal. Negative for fracture or focal lesion. Sinuses/Orbits: Visualized orbits show no acute finding. Trace bilateral ethmoid and right maxillary sinus mucosal thickening at the imaged levels. CT CERVICAL SPINE FINDINGS Alignment: Cervical levocurvature. Straightening of the expected cervical lordosis. No significant spondylolisthesis. Skull base and vertebrae: The basion-dental and atlanto-dental intervals are maintained.No evidence of acute fracture to the cervical spine. Soft tissues and spinal canal: No prevertebral fluid or swelling. No visible canal hematoma. Disc levels: Cervical spondylosis. Most notably at C5-C6, there is moderate disc degeneration, a disc bulge, a superimposed central disc protrusion, endplate spurring and uncovertebral hypertrophy. Bilateral neural foraminal narrowing and apparent moderate spinal canal stenosis at this level. Upper chest: No consolidation  within the imaged lung apices. No visible pneumothorax. IMPRESSION: CT head: 1. Unremarkable non-contrast CT appearance of the brain. No evidence of acute intracranial abnormality. 2. Mild paranasal sinus mucosal thickening. CT cervical spine: 1. No evidence of acute fracture to the cervical spine. 2. Nonspecific straightening of the expected cervical lordosis. 3. Cervical levocurvature. 4. Cervical spondylosis, as described and greatest at C5-C6. Electronically Signed   By: Kellie Simmering DO   On: 11/28/2020 12:29   DG Chest Port 1 View  Result Date: 11/28/2020 CLINICAL DATA:  Abnormal EKG EXAM: PORTABLE CHEST 1 VIEW COMPARISON:  09/01/2012 FINDINGS: Artifact from EKG leads. Normal heart size and mediastinal contours. No acute infiltrate or edema. No effusion or pneumothorax. No acute osseous findings. IMPRESSION: Negative chest. Electronically Signed   By: Monte Fantasia M.D.   On: 11/28/2020 11:49        Subjective: Patient is feeling better, no nausea or vomiting, tolerating po well.   Discharge Exam: Vitals:   12/02/20 0500 12/02/20 0804  BP: 137/61   Pulse:    Resp: 16   Temp:  97.7 F (36.5 C)  SpO2:     Vitals:   12/02/20 0300 12/02/20 0400 12/02/20 0500 12/02/20 0804  BP: (!) 118/36 (!) 126/39 137/61   Pulse: 60 (!) 59    Resp: _0 Temp:  97.9 F (36.6 C)  97.7 F (36.5 C)  TempSrc:  Axillary  Oral  SpO2: 97%     Weight:   60 kg   Height:        General: Not in pain or dyspnea.  Neurology: Awake and alert, non  focal  E ENT: no pallor, no icterus, oral mucosa moist Cardiovascular: No JVD. S1-S2 present, rhythmic, no gallops, rubs, or murmurs. No lower extremity edema. Pulmonary: positive breath sounds bilaterally, adequate air movement, no wheezing, rhonchi or rales. Gastrointestinal. Abdomen soft and non tender Skin. No rashes Musculoskeletal: no joint deformities   The results of significant diagnostics from this hospitalization (including imaging,  microbiology, ancillary and laboratory) are listed below for reference.     Microbiology: Recent Results (from the past 240 hour(s))  Resp Panel by RT-PCR (Flu A&B, Covid) Nasopharyngeal Swab     Status: None   Collection Time: 11/28/20  1:45 PM   Specimen: Nasopharyngeal Swab; Nasopharyngeal(NP) swabs in vial transport medium  Result Value Ref Range Status   SARS Coronavirus 2 by RT PCR NEGATIVE NEGATIVE Final    Comment: (NOTE) SARS-CoV-2 target nucleic acids are NOT DETECTED.  The SARS-CoV-2 RNA is generally detectable in upper respiratory specimens during the acute phase of infection. The lowest concentration of SARS-CoV-2 viral copies this assay can detect is 138 copies/mL. A negative result does not preclude SARS-Cov-2 infection and should not be used as the sole basis for treatment or other patient management decisions. A negative result may occur with  improper specimen collection/handling, submission of specimen other than nasopharyngeal swab, presence of viral mutation(s) within the areas targeted by this assay, and inadequate number of viral copies(<138 copies/mL). A negative result must be combined with clinical observations, patient history, and epidemiological information. The expected result is Negative.  Fact Sheet for Patients:  EntrepreneurPulse.com.au  Fact Sheet for Healthcare Providers:  IncredibleEmployment.be  This test is no t yet approved or cleared by the [place] FDA and  has been authorized for detection and/or diagnosis of SARS-CoV-2 by FDA under an Emergency Use Authorization (EUA). This EUA will remain  in effect (meaning this test can be used) for the duration of the COVID-19 declaration under Section 564(b)(1) of the Act, 21 U.S.C.section 360bbb-3(b)(1), unless the authorization is terminated  or revoked sooner.       Influenza A by PCR NEGATIVE NEGATIVE Final   Influenza B by PCR NEGATIVE NEGATIVE  Final    Comment: (NOTE) The Xpert Xpress SARS-CoV-2/FLU/RSV plus assay is intended as an aid in the diagnosis of influenza from Nasopharyngeal swab specimens and should not be used as a sole basis for treatment. Nasal washings and aspirates are unacceptable for Xpert Xpress SARS-CoV-2/FLU/RSV testing.  Fact Sheet for Patients: EntrepreneurPulse.com.au  Fact Sheet for Healthcare Providers: IncredibleEmployment.be  This test is not yet approved or cleared by the [place] FDA and has been authorized for detection and/or diagnosis of SARS-CoV-2 by FDA under an Emergency Use Authorization (EUA). This EUA will remain in effect (meaning this test can be used) for the duration of the COVID-19 declaration under Section 564(b)(1) of the Act, 21 U.S.C. section 360bbb-3(b)(1), unless the authorization is terminated or revoked.  Performed at [facility], [place] [address], [place],  [address]   MRSA PCR Screening     Status: None   Collection Time: 11/28/20  4:47 PM   Specimen: Nasal Mucosa; Nasopharyngeal  Result Value Ref Range Status   MRSA by PCR NEGATIVE NEGATIVE Final    Comment:        The GeneXpert MRSA Assay (FDA approved for NASAL specimens only), is one component of a comprehensive MRSA colonization surveillance program. It is not intended to diagnose MRSA infection nor to guide or monitor treatment for MRSA infections. Performed at  [facility], [place] [address], [place], [place] [address]      Labs: BNP (last 3 results) No results for input(s): BNP in the last 8760 hours. Basic Metabolic Panel: Recent Labs  Lab 11/29/20 0241 11/29/20 0723 11/29/20 1052 11/30/20 0114 11/30/20 0529 12/02/20 0243  NA 138 135 134* 134* 135 137  K 2.9* 4.2 4.1 3.9 3.9 4.1  CL 104 104 100 105 107 108  CO2 26 21* 22 23 21* 23  GLUCOSE 156* 193* 311* 256* 283* 200*  BUN _0 21*  CREATININE  0.66 0.79 1.00 0.68 0.46 0.64  CALCIUM 9.6 9.6 9.6 9.1 9.1 9.1  MG 2.0  --   --  2.1 2.0  --   PHOS  --   --   --  4.2 4.2  --    Liver Function Tests: Recent Labs  Lab 11/28/20 1108 11/30/20 0114  AST 20 21  ALT 20 11  ALKPHOS 79 73  BILITOT 0.3 0.3  PROT 8.3* 6.1*  ALBUMIN 4.2 3.4*   No results for input(s): LIPASE, AMYLASE in the last 168 hours. No results for input(s): AMMONIA in the last 168 hours. CBC: Recent Labs  Lab 11/28/20 1040 11/29/20 0241 11/30/20 0114 11/30/20 0529  WBC 6.7 6.7 6.2 5.2  HGB 12.4 11.2* 10.3* 10.4*  HCT 35.3* 32.9* 30.0* 31.0*  MCV 85.3 87.0 88.5 91.4  PLT 276 244 235 230   Cardiac Enzymes: No results for input(s): CKTOTAL, CKMB, CKMBINDEX, TROPONINI in the last 168 hours. BNP: Invalid input(s): POCBNP CBG: Recent Labs  Lab 12/01/20 1115 12/01/20 1632 12/01/20 2121 12/02/20 0526 12/02/20 0730  GLUCAP 215* 105* 188* 182* 198*   D-Dimer No results for input(s): DDIMER in the last 72 hours. Hgb A1c No results for input(s): HGBA1C in the last 72 hours. Lipid Profile Recent Labs    11/30/20 0114  CHOL 218*  HDL 24*  LDLCALC UNABLE TO CALCULATE IF TRIGLYCERIDE OVER 400 mg/dL  TRIG 862*  CHOLHDL 9.1  LDLDIRECT 86.1   Thyroid function studies No results for input(s): TSH, T4TOTAL, T3FREE, THYROIDAB in the last 72 hours.  Invalid input(s): FREET3 Anemia work up No results for input(s): VITAMINB12, FOLATE, FERRITIN, TIBC, IRON, RETICCTPCT in the last 72 hours. Urinalysis    Component Value Date/Time   COLORURINE COLORLESS (A) 11/28/2020 1040   APPEARANCEUR CLEAR 11/28/2020 1040   LABSPEC 1.018 11/28/2020 1040   PHURINE 7.0 11/28/2020 1040   GLUCOSEU >=500 (A) 11/28/2020 1040   GLUCOSEU NEGATIVE 04/10/2019 1549   HGBUR NEGATIVE 11/28/2020 1040   BILIRUBINUR NEGATIVE 11/28/2020 1040   KETONESUR NEGATIVE 11/28/2020 1040   PROTEINUR NEGATIVE 11/28/2020 1040   UROBILINOGEN 0.2 04/10/2019 1549   NITRITE NEGATIVE 11/28/2020  1040   LEUKOCYTESUR NEGATIVE 11/28/2020 1040   Sepsis Labs Invalid input(s): PROCALCITONIN,  WBC,  LACTICIDVEN Microbiology Recent Results (from the past 240 hour(s))  Resp Panel by RT-PCR (Flu A&B, Covid) Nasopharyngeal Swab     Status: None   Collection Time: 11/28/20  1:45 PM   Specimen: Nasopharyngeal Swab; Nasopharyngeal(NP) swabs in vial transport medium  Result Value Ref Range Status   SARS Coronavirus 2 by RT PCR NEGATIVE NEGATIVE Final    Comment: (NOTE) SARS-CoV-2 target nucleic acids are NOT DETECTED.  The SARS-CoV-2 RNA is generally detectable in upper respiratory specimens during the acute phase of infection. The lowest concentration of SARS-CoV-2 viral copies this assay can detect is 138 copies/mL. A negative result does not preclude SARS-Cov-2 infection  and should not be used as the sole basis for treatment or other patient management decisions. A negative result may occur with  improper specimen collection/handling, submission of specimen other than nasopharyngeal swab, presence of viral mutation(s) within the areas targeted by this assay, and inadequate number of viral copies(<138 copies/mL). A negative result must be combined with clinical observations, patient history, and epidemiological information. The expected result is Negative.  Fact Sheet for Patients:  EntrepreneurPulse.com.au  Fact Sheet for Healthcare Providers:  IncredibleEmployment.be  This test is no t yet approved or cleared by the [place] FDA and  has been authorized for detection and/or diagnosis of SARS-CoV-2 by FDA under an Emergency Use Authorization (EUA). This EUA will remain  in effect (meaning this test can be used) for the duration of the COVID-19 declaration under Section 564(b)(1) of the Act, 21 U.S.C.section 360bbb-3(b)(1), unless the authorization is terminated  or revoked sooner.       Influenza A by PCR NEGATIVE NEGATIVE Final    Influenza B by PCR NEGATIVE NEGATIVE Final    Comment: (NOTE) The Xpert Xpress SARS-CoV-2/FLU/RSV plus assay is intended as an aid in the diagnosis of influenza from Nasopharyngeal swab specimens and should not be used as a sole basis for treatment. Nasal washings and aspirates are unacceptable for Xpert Xpress SARS-CoV-2/FLU/RSV testing.  Fact Sheet for Patients: EntrepreneurPulse.com.au  Fact Sheet for Healthcare Providers: IncredibleEmployment.be  This test is not yet approved or cleared by the [place] FDA and has been authorized for detection and/or diagnosis of SARS-CoV-2 by FDA under an Emergency Use Authorization (EUA). This EUA will remain in effect (meaning this test can be used) for the duration of the COVID-19 declaration under Section 564(b)(1) of the Act, 21 U.S.C. section 360bbb-3(b)(1), unless the authorization is terminated or revoked.  Performed at [facility], [place] [address], [place], [place] [address]   MRSA PCR Screening     Status: None   Collection Time: 11/28/20  4:47 PM   Specimen: Nasal Mucosa; Nasopharyngeal  Result Value Ref Range Status   MRSA by PCR NEGATIVE NEGATIVE Final    Comment:        The GeneXpert MRSA Assay (FDA approved for NASAL specimens only), is one component of a comprehensive MRSA colonization surveillance program. It is not intended to diagnose MRSA infection nor to guide or monitor treatment for MRSA infections. Performed at [facility], [place] [address] [place], [place] [address]      Time coordinating discharge: 45 minutes  SIGNED:   Tawni Millers, MD  [facility] 12/02/2020, 8:24 AM

## 2020-12-02 NOTE — Progress Notes (Signed)
Pt discharged. PIV removed, belongings returned, and AVS went over with patient. She stated she had no further questions. This wheeled her down to the front.

## 2020-12-02 NOTE — Telephone Encounter (Signed)
Please refill as per office routine med refill policy (all routine meds refilled for 3 mo or monthly per pt preference up to one year from last visit, then month to month grace period for 3 mo, then further med refills will have to be denied)  

## 2020-12-02 NOTE — Progress Notes (Signed)
OT Cancellation Note  Patient Details Name: YISEL MEGILL MRN: [id number] DOB: 11-02-1960   Cancelled Treatment:    Reason Eval/Treat Not Completed: OT screened, no needs identified, will sign off. Patient independent with PT and reports no difficulties with ADLs. OT evaluation not indicated at this time.  Shalena Ezzell L Ulice Follett 12/02/2020, 7:58 AM

## 2020-12-02 NOTE — TOC Transition Note (Signed)
Transition of Care [facility]) - CM/SW Discharge Note   Patient Details  Name: Jessica Lara MRN: [id number] Date of Birth: 1961/03/25  [facility] CM/SW Contact:  Lynnell Catalan, RN Phone Number: 12/02/2020, 10:18 AM   Clinical Narrative:    Pt provided with [place] letter. Pt given list of pharmacies that participate in the [facility] program. Pt informed that all scripts would be $3 each with the [facility] letter.

## 2020-12-03 LAB — HEMOGLOBIN A1C
Hgb A1c MFr Bld: >15.5 % — ABNORMAL HIGH (ref 4.8–5.6)
Mean Plasma Glucose: >398 mg/dL

## 2020-12-04 ENCOUNTER — Telehealth: Payer: Self-pay

## 2020-12-04 NOTE — Telephone Encounter (Signed)
Transition Care Management Follow-up Telephone Call  Date of discharge and from where: 12/02/2020 from [facility]  How have you been since you were released from the hospital? Doing good; checking glucose and watching my diet  Any questions or concerns? No  Items Reviewed:  Did the pt receive and understand the discharge instructions provided? Yes   Medications obtained and verified? Yes   Other? No   Any new allergies since your discharge? No   Dietary orders reviewed? Yes, low sodium heart healthy  Do you have support at home? Yes   Home Care and Equipment/Supplies: Were home health services ordered? no If so, what is the name of the agency? n/a  Has the agency set up a time to come to the patient's home? not applicable Were any new equipment or medical supplies ordered?  No What is the name of the medical supply agency? n/a Were you able to get the supplies/equipment? not applicable Do you have any questions related to the use of the equipment or supplies? No  Functional Questionnaire: (I = Independent and D = Dependent) ADLs: I  Bathing/Dressing- I  Meal Prep- I  Eating- I  Maintaining continence- I  Transferring/Ambulation- I  Managing Meds- I  Follow up appointments reviewed:   PCP Hospital f/u appt confirmed? Yes  Scheduled to see Cathlean Cower, MD on 12/09/2020 @ 2:40 pm.  [facility] f/u appt confirmed? No    Are transportation arrangements needed? No   If their condition worsens, is the pt aware to call PCP or go to the Emergency Dept.? Yes  Was the patient provided with contact information for the PCP's office or ED? Yes  Was to pt encouraged to call back with questions or concerns? Yes

## 2020-12-09 ENCOUNTER — Inpatient Hospital Stay: Payer: Self-pay | Admitting: Internal Medicine

## 2020-12-09 ENCOUNTER — Other Ambulatory Visit: Payer: Self-pay

## 2020-12-28 ENCOUNTER — Other Ambulatory Visit: Payer: Self-pay | Admitting: Internal Medicine

## 2020-12-28 NOTE — Telephone Encounter (Signed)
Please refill as per office routine med refill policy (all routine meds refilled for 3 mo or monthly per pt preference up to one year from last visit, then month to month grace period for 3 mo, then further med refills will have to be denied)  

## 2020-12-30 NOTE — Telephone Encounter (Signed)
Please refill as per office routine med refill policy (all routine meds refilled for 3 mo or monthly per pt preference up to one year from last visit, then month to month grace period for 3 mo, then further med refills will have to be denied)  

## 2021-01-16 ENCOUNTER — Inpatient Hospital Stay: Payer: Self-pay | Admitting: Internal Medicine

## 2021-05-02 ENCOUNTER — Other Ambulatory Visit: Payer: Self-pay | Admitting: Internal Medicine

## 2021-05-02 MED ORDER — OMEGA-3-ACID ETHYL ESTERS 1 G PO CAPS: 1.0000 g | ORAL_CAPSULE | Freq: Two times a day (BID) | ORAL | 3 refills | Status: AC

## 2021-05-02 MED ORDER — OMEGA-3-ACID ETHYL ESTERS 1 G PO CAPS: 1.0000 g | ORAL_CAPSULE | Freq: Two times a day (BID) | ORAL | 3 refills | Status: DC

## 2022-08-23 IMAGING — CT CT CERVICAL SPINE W/O CM
3 of 4 series · 13 of 33 positions shown, 16 images · non-contrast
Comparison: No pertinent prior exams available for comparison.

CLINICAL DATA: Neck trauma, intoxicated or up tended. Syncope,
simple, normal neuro exam.

EXAM:
CT HEAD WITHOUT CONTRAST
CT CERVICAL SPINE WITHOUT CONTRAST
TECHNIQUE: Multidetector CT imaging of the head and cervical spine was
performed following the standard protocol without intravenous
contrast. Multiplanar CT image reconstructions of the cervical spine
were also generated.

[Series 4: orthogonal bone · axial · 0.25mm/px · z∈[-235,-106]mm · 5 of 96 slices shown, 7 images]
[im 14/96  soft-tissue]
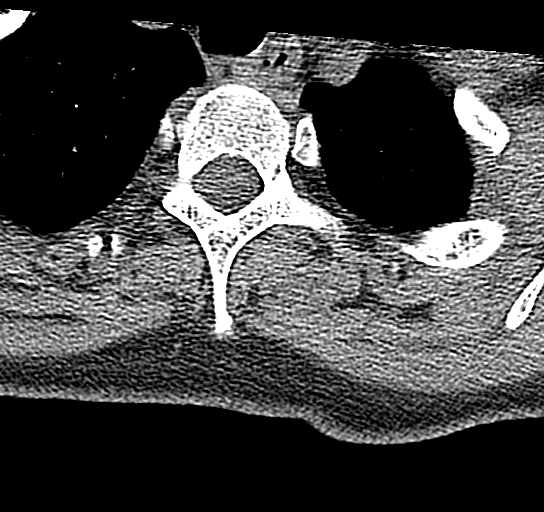
[im 14/96  bone]
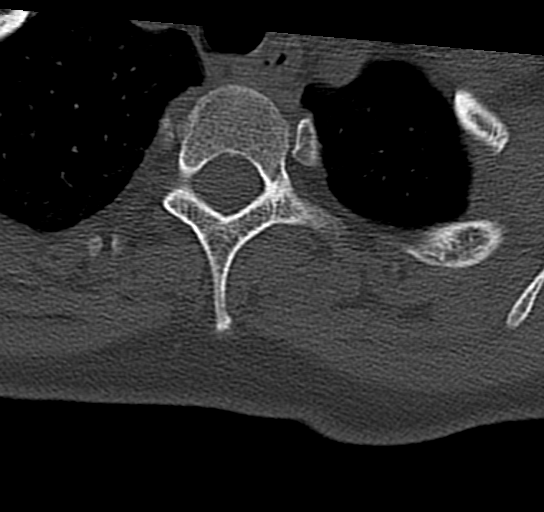
[im 28/96  bone]
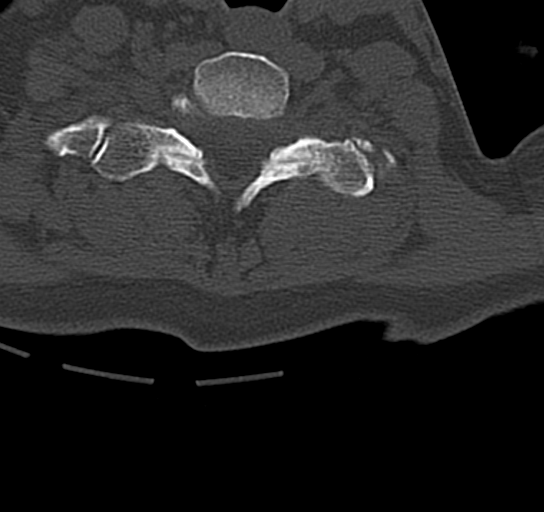
[im 55/96  bone]
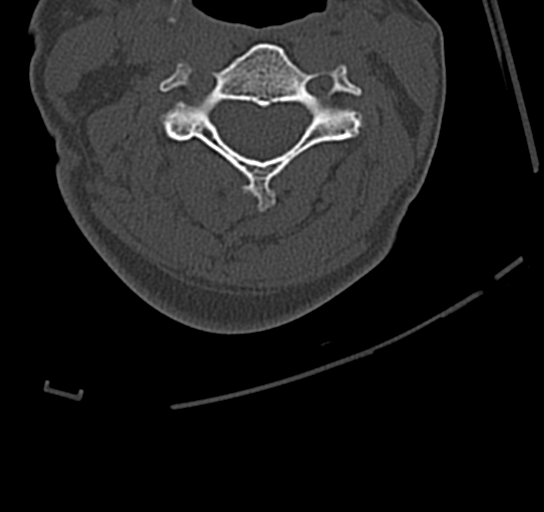
[im 68/96  bone]
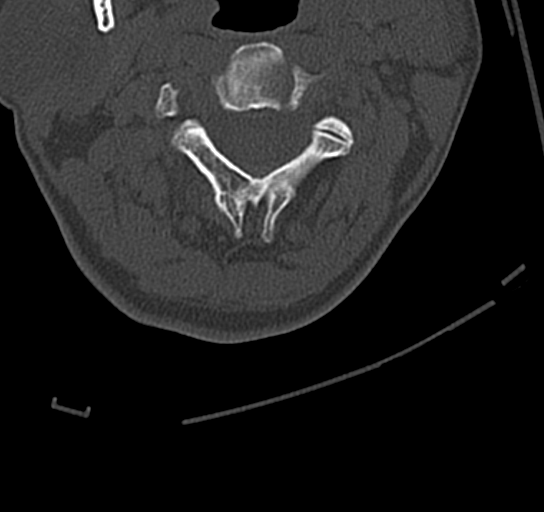
[im 82/96  soft-tissue]
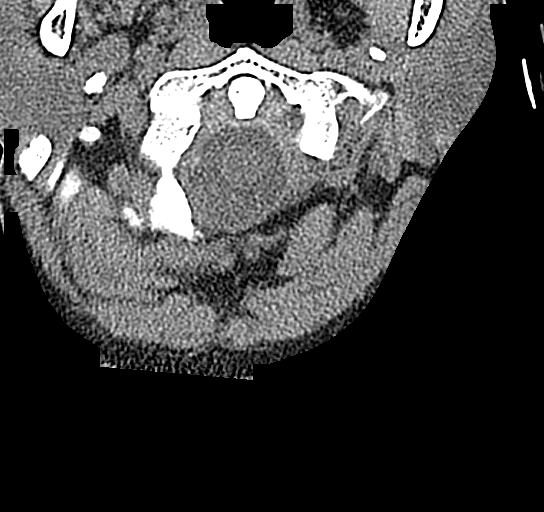
[im 82/96  bone]
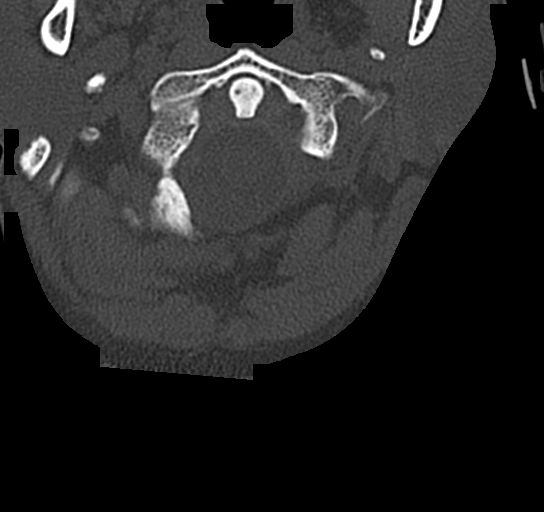

[Series 5: coronal bone · coronal · 0.28mm/px · 3 of 50 slices shown]
[im 10/50  bone]
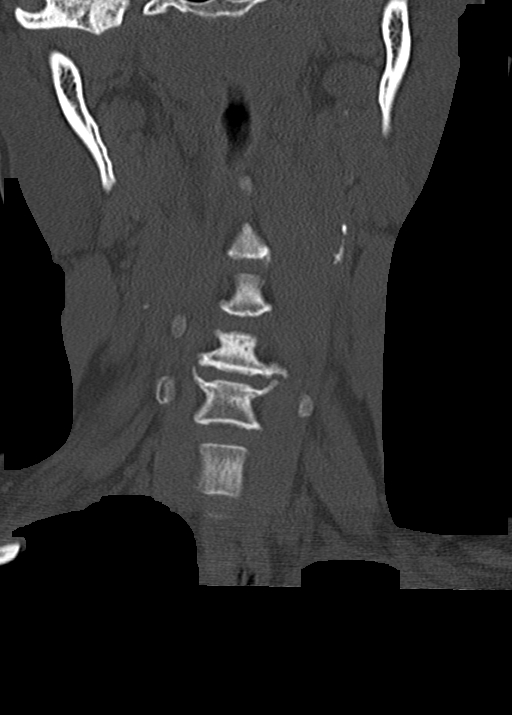
[im 20/50  bone]
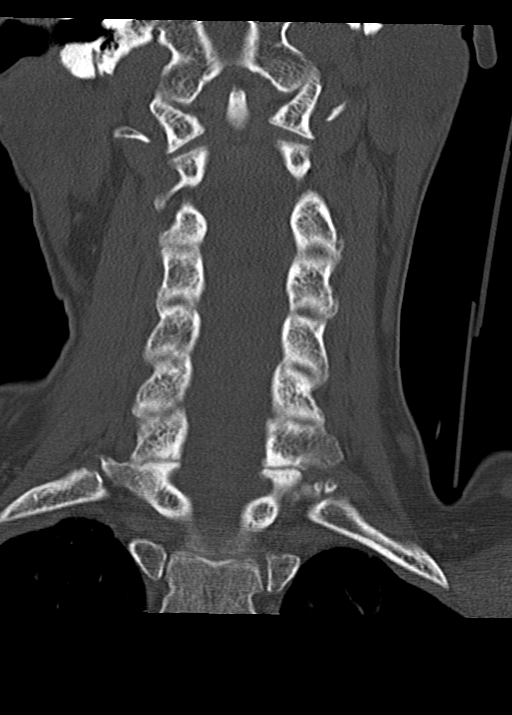
[im 30/50  bone]
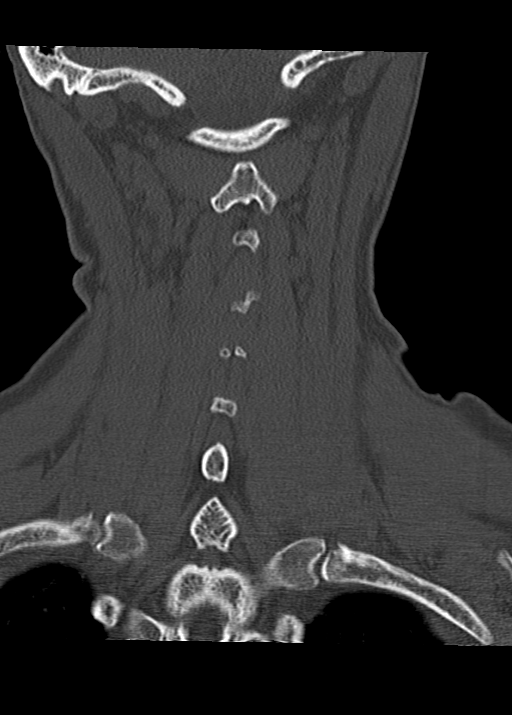

[Series 6: sagittal bone · sagittal · 0.31mm/px · 5 of 41 slices shown, 6 images]
[im 14/41  bone]
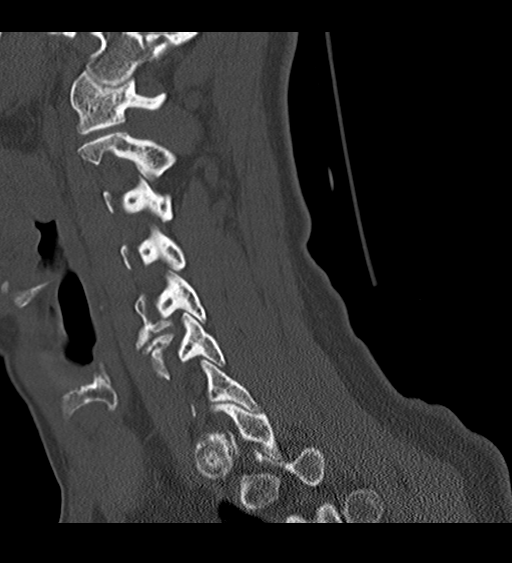
[im 17/41  bone]
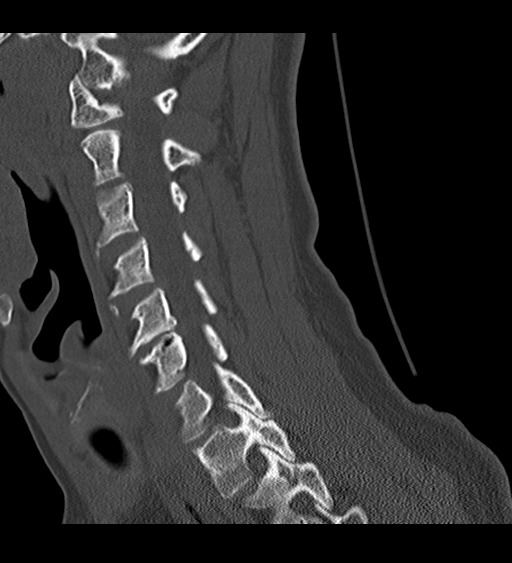
[im 21/41  soft-tissue]
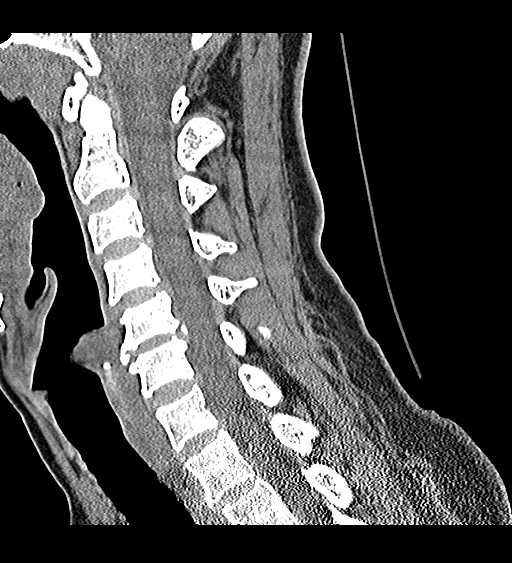
[im 21/41  bone]
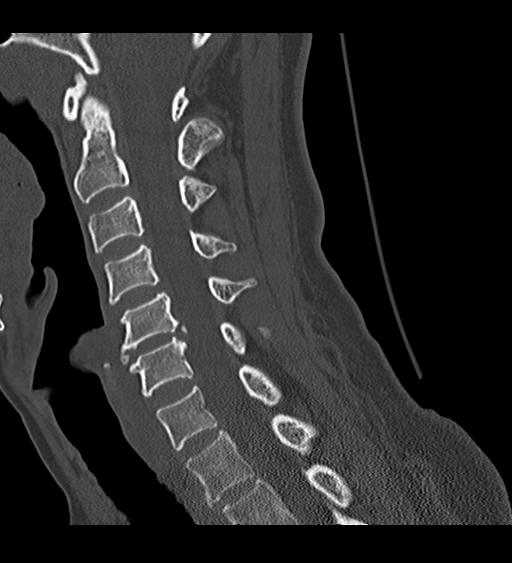
[im 24/41  bone]
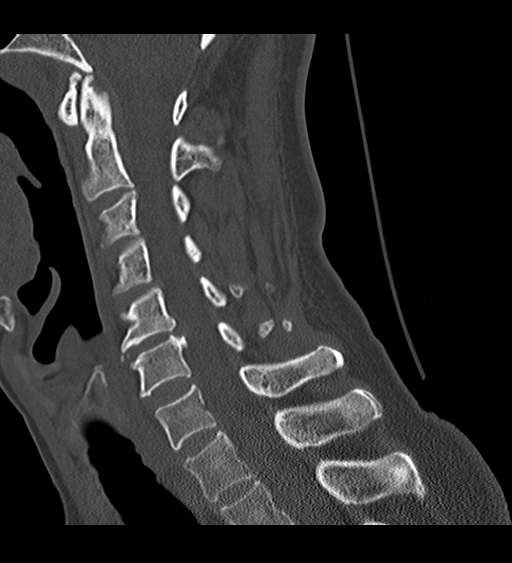
[im 27/41  bone]
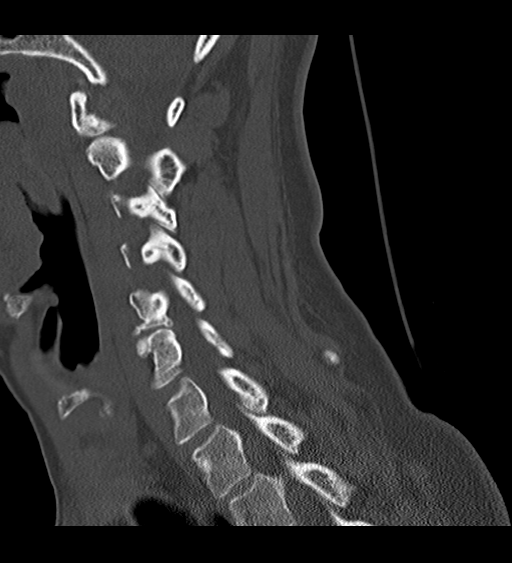

[13 of 33 positions shown; findings below may reference images not displayed]

FINDINGS: CT HEAD FINDINGS

Brain:

Cerebral volume is normal.

There is no acute intracranial hemorrhage.

No demarcated cortical infarct.

No extra-axial fluid collection.

No evidence of intracranial mass.

No midline shift.

Vascular: No hyperdense vessel.  Atherosclerotic calcifications.

Skull: Normal. Negative for fracture or focal lesion.

Sinuses/Orbits: Visualized orbits show no acute finding. Trace
bilateral ethmoid and right maxillary sinus mucosal thickening at
the imaged levels.

CT CERVICAL SPINE FINDINGS

Alignment: Cervical levocurvature. Straightening of the expected
cervical lordosis. No significant spondylolisthesis.

Skull base and vertebrae: The basion-dental and atlanto-dental
intervals are maintained.No evidence of acute fracture to the
cervical spine.

Soft tissues and spinal canal: No prevertebral fluid or swelling. No
visible canal hematoma.

Disc levels: Cervical spondylosis. Most notably at C5-C6, there is
moderate disc degeneration, a disc bulge, a superimposed central
disc protrusion, endplate spurring and uncovertebral hypertrophy.
Bilateral neural foraminal narrowing and apparent moderate spinal
canal stenosis at this level.

Upper chest: No consolidation within the imaged lung apices. No
visible pneumothorax.
IMPRESSION: CT head:

1. Unremarkable non-contrast CT appearance of the brain. No evidence
of acute intracranial abnormality.
2. Mild paranasal sinus mucosal thickening.

CT cervical spine:

1. No evidence of acute fracture to the cervical spine.
2. Nonspecific straightening of the expected cervical lordosis.
3. Cervical levocurvature.
4. Cervical spondylosis, as described and greatest at C5-C6.

## 2024-01-19 ENCOUNTER — Other Ambulatory Visit: Payer: Self-pay | Admitting: Family Medicine

## 2024-01-19 DIAGNOSIS — Z1231 Encounter for screening mammogram for malignant neoplasm of breast: Secondary | ICD-10-CM

## 2024-02-11 ENCOUNTER — Ambulatory Visit
Admission: RE | Admit: 2024-02-11 | Discharge: 2024-02-11 | Disposition: A | Discharge status: Home / Self Care | Source: Ambulatory Visit | Attending: Family Medicine | Admitting: Family Medicine

## 2024-02-11 DIAGNOSIS — Z1231 Encounter for screening mammogram for malignant neoplasm of breast: Secondary | ICD-10-CM

## 2024-05-09 ENCOUNTER — Other Ambulatory Visit (HOSPITAL_COMMUNITY): Payer: Self-pay | Admitting: Adult Health Nurse Practitioner

## 2024-05-09 DIAGNOSIS — G8929 Other chronic pain: Secondary | ICD-10-CM

## 2024-05-09 DIAGNOSIS — M545 Low back pain, unspecified: Secondary | ICD-10-CM

## 2024-07-28 NOTE — Progress Notes (Signed)
 " [facility] - Clinic Note  08/09/2024   CHIEF COMPLAINT Patient presents for Retina Follow Up  HISTORY OF PRESENT ILLNESS: Jessica Lara is a 64 y.o. female who presents to the clinic today for:  HPI     Retina Follow Up   Patient presents with  Diabetic Retinopathy.  In both eyes.  I, the attending physician,  performed the HPI with the patient and updated documentation appropriately.        Comments   Referral [id number] DM Exam. Pt states she found out she was diabetic about 5 years ago. Pt states her diabetes is diet controlled.  A1c=5.3, 01/05/2024      Last edited by Valdemar Rogue, MD on 08/09/2024  9:59 PM.     Patient states vision stable. Has hx of elevated blood sugars over 800 that she was hospitalized for, 8 years ago. No longer medicated for diabetes. Diet controlled. Previously on insulin  and oral meds.  Referring physician: Stephen Rojelio PARAS, NP [address] [place],  [place] [address]  HISTORICAL INFORMATION:  Selected notes from the MEDICAL RECORD NUMBER Referred by Rojelio Stephen @ [address] [facility]: Dr. Jama at [facility] Hx- PMH-   CURRENT MEDICATIONS: No current outpatient medications on file. (Ophthalmic Drugs)   No current facility-administered medications for this visit. (Ophthalmic Drugs)   Current Outpatient Medications (Other)  Medication Sig   atorvastatin  (LIPITOR) 40 MG tablet Take 1 tablet (40 mg total) by mouth daily.   blood glucose meter kit and supplies Dispense based on patient and insurance preference. Use up to four times daily as directed. (FOR ICD-10 E10.9, E11.9).   DULoxetine  (CYMBALTA ) 30 MG capsule Take 1 capsule (30 mg total) by mouth daily.   fluticasone (FLONASE) 50 MCG/ACT nasal spray Place 1-2 sprays into both nostrils daily as needed for allergies or rhinitis.   glimepiride  (AMARYL ) 2 MG tablet Take 1 tablet (2 mg total) by mouth daily with breakfast.   metFORMIN  (GLUCOPHAGE ) 500  MG tablet Take 1 tablet (500 mg total) by mouth 2 (two) times daily with a meal.   Multiple Vitamin (MULTIVITAMIN WITH MINERALS) TABS tablet Take 1 tablet by mouth daily.   olmesartan -hydrochlorothiazide  (BENICAR  HCT) 40-25 MG tablet TAKE 1 TABLET BY MOUTH EVERY DAY   omega-3 acid ethyl esters (LOVAZA ) 1 g capsule Take 1 capsule (1 g total) by mouth 2 (two) times daily.   traZODone  (DESYREL ) 50 MG tablet Take 1 tablet (50 mg total) by mouth at bedtime as needed for sleep.   No current facility-administered medications for this visit. (Other)   REVIEW OF SYSTEMS: ROS   Positive for: Musculoskeletal, Cardiovascular Last edited by Elnor Avelina RAMAN, COT on 08/09/2024  9:33 AM.     ALLERGIES Allergies[1] PAST MEDICAL HISTORY Past Medical History:  Diagnosis Date   Anxiety    ANXIETY DEPRESSION    Arthritis    Chicken pox    H/O: hysterectomy    Hypertension    Other and unspecified hyperlipidemia    SMOKER    Unspecified essential hypertension    Past Surgical History:  Procedure Laterality Date   ABDOMINAL HYSTERECTOMY  1999   fibroids   COLONOSCOPY     ESOPHAGOGASTRODUODENOSCOPY  01/09/2012   Procedure: ESOPHAGOGASTRODUODENOSCOPY (EGD);  Surgeon: Norleen LOISE Kiang, MD;  Location: [facility] ENDOSCOPY;  Service: Endoscopy;  Laterality: N/A;   single oopherectomy  1999   FAMILY HISTORY Family History  Problem Relation Age of Onset  Diabetes Mother    Hypertension Mother    Hyperlipidemia Mother    Stomach cancer Father    Heart disease Sister    Hypertension Brother    Diabetes Brother    Colon cancer Neg Hx    Esophageal cancer Neg Hx    Rectal cancer Neg Hx    Colon polyps Neg Hx    SOCIAL HISTORY Social History[2]     OPHTHALMIC EXAM:  Base Eye Exam     Visual Acuity (Snellen - Linear)       Right Left   Dist cc 20/30 -2 20/25 -1   Dist ph cc 20/25 -2 20/20 -2    Correction: Glasses         Tonometry (Tonopen, 9:28 AM)       Right Left   Pressure 17 17          Pupils       Pupils Dark Light Shape React APD   Right PERRL 3 2 Round Brisk None   Left PERRL 3 2 Round Brisk None         Visual Fields       Left Right    Full Full         Extraocular Movement       Right Left    Full, Ortho Full, Ortho         Neuro/Psych     Oriented x3: Yes         Dilation     Both eyes: 1.0% Mydriacyl, 2.5% Phenylephrine @ 9:29 AM           Slit Lamp and Fundus Exam     External Exam       Right Left   External Normal Normal         Slit Lamp Exam       Right Left   Lids/Lashes Normal Normal   Conjunctiva/Sclera mild melanosis mild melanosis   Cornea mild tear film debris mild tear film debris   Anterior Chamber Deep and Clear Deep and Clear   Iris Round and Dilated, no NVI Round and Dilated, no NVI   Lens 2-3+ Nuclear sclerosis, 2-3+ Cortical cataract 2-3+ Nuclear sclerosis, 2-3+ Cortical cataract   Anterior Vitreous mild syneresis mild syneresis         Fundus Exam       Right Left   Disc Pink and Sharp, mild PPP Pink and Sharp, mild PPP   C/D Ratio 0.5 0.5   Macula Flat, good foveal reflex, no heme or edema Flat, good foveal reflex, no heme or edema   Vessels Attenuated, mild tortuosity Attenuated, mild tortuosity   Periphery Attached, no heme Attached, no heme           Refraction     Wearing Rx       Sphere Cylinder Axis Add   Right +0.75 +0.75 008 +2.50   Left +0.75 +0.75 169 +2.50    Age: 55-2 years   Type: Bifocal           IMAGING AND PROCEDURES  Imaging and Procedures for 08/09/2024  OCT, Retina - OU - Both Eyes       Right Eye Quality was good. Central Foveal Thickness: 208. Progression has no prior data. Findings include normal foveal contour, no IRF, no SRF, vitreomacular adhesion (No DME).   Left Eye Quality was good. Central Foveal Thickness: 212. Progression has no prior data. Findings include normal foveal contour, no IRF, no  SRF, vitreomacular adhesion .    Notes *Images captured and stored on drive  Diagnosis / Impression:  NFP, no IRF/SRF OU No DME OU  Clinical management:  See below  Abbreviations: NFP - Normal foveal profile. CME - cystoid macular edema. PED - pigment epithelial detachment. IRF - intraretinal fluid. SRF - subretinal fluid. EZ - ellipsoid zone. ERM - epiretinal membrane. ORA - outer retinal atrophy. ORT - outer retinal tubulation. SRHM - subretinal hyper-reflective material. IRHM - intraretinal hyper-reflective material           ASSESSMENT/PLAN:   ICD-10-CM   1. Diabetes mellitus type 2 without retinopathy (HCC)  E11.9 OCT, Retina - OU - Both Eyes    2. Diet-controlled diabetes mellitus (HCC)  E11.9     3. Essential hypertension  I10     4. Hypertensive retinopathy of both eyes  H35.033     5. Combined forms of age-related cataract of both eyes  H25.813      1,2. Diabetes mellitus, type 2 without retinopathy  - A1C 5.3 (07.01.25)  - Pt not taking any medications for DM, diet controlled  - BCVA OD 20/25, OS 20/20  - OCT shows NFP, no IRF/SRF and no DME OU - The incidence, risk factors for progression, natural history and treatment options for diabetic retinopathy  were discussed with patient.   - The need for close monitoring of blood glucose, blood pressure, and serum lipids, avoiding cigarette or any type of tobacco, and the need for long term follow up was also discussed with patient. - f/u in 1 year, DFE, OCT, sooner PRN  3,4. Hypertensive retinopathy OU - discussed importance of tight BP control - monitor   5. Mixed Cataract OU - The symptoms of cataract, surgical options, and treatments and risks were discussed with patient. - discussed diagnosis and progression - monitor   Ophthalmic Meds Ordered this visit:  No orders of the defined types were placed in this encounter.    Return in about 1 year (around 08/09/2025) for DM Exam, DFE, OCT.  There are no Patient Instructions on file for this  visit.  Explained the diagnoses, plan, and follow up with the patient and they expressed understanding.  Patient expressed understanding of the importance of proper follow up care.    This document serves as a record of services personally performed by Redell JUDITHANN Hans, MD, PhD. It was created on their behalf by Wanda GEANNIE Keens, COT an ophthalmic technician. The creation of this record is the provider's dictation and/or activities during the visit.    Electronically signed by:  Wanda GEANNIE Keens, COT  08/09/24 10:02 PM  This document serves as a record of services personally performed by Redell JUDITHANN Hans, MD, PhD. It was created on their behalf by Almetta Pesa, an ophthalmic technician. The creation of this record is the provider's dictation and/or activities during the visit.    Electronically signed by: Almetta Pesa, OA, 08/09/24  10:02 PM  Redell JUDITHANN Hans, M.D., Ph.D. Diseases & Surgery of the Retina and Vitreous [facility] 08/09/2024  I have reviewed the above documentation for accuracy and completeness, and I agree with the above. Redell JUDITHANN Hans, M.D., Ph.D. 08/09/24 10:03 PM   Abbreviations: M myopia (nearsighted); A astigmatism; H hyperopia (farsighted); P presbyopia; Mrx spectacle prescription;  CTL contact lenses; OD right eye; OS left eye; OU both eyes  XT exotropia; ET esotropia; PEK punctate epithelial keratitis; PEE punctate epithelial erosions; DES dry eye syndrome;  MGD meibomian gland dysfunction; ATs artificial tears; PFAT's preservative free artificial tears; NSC nuclear sclerotic cataract; PSC posterior subcapsular cataract; ERM epi-retinal membrane; PVD posterior vitreous detachment; RD retinal detachment; DM diabetes mellitus; DR diabetic retinopathy; NPDR non-proliferative diabetic retinopathy; PDR proliferative diabetic retinopathy; CSME clinically significant macular edema; DME diabetic macular edema; dbh dot blot hemorrhages; CWS cotton  wool spot; POAG primary open angle glaucoma; C/D cup-to-disc ratio; HVF humphrey visual field; GVF goldmann visual field; OCT optical coherence tomography; IOP intraocular pressure; BRVO Branch retinal vein occlusion; CRVO central retinal vein occlusion; CRAO central retinal artery occlusion; BRAO branch retinal artery occlusion; RT retinal tear; SB scleral buckle; PPV pars plana vitrectomy; VH Vitreous hemorrhage; PRP panretinal laser photocoagulation; IVK intravitreal kenalog; VMT vitreomacular traction; MH Macular hole;  NVD neovascularization of the disc; NVE neovascularization elsewhere; AREDS age related eye disease study; ARMD age related macular degeneration; POAG primary open angle glaucoma; EBMD epithelial/anterior basement membrane dystrophy; ACIOL anterior chamber intraocular lens; IOL intraocular lens; PCIOL posterior chamber intraocular lens; Phaco/IOL phacoemulsification with intraocular lens placement; PRK photorefractive keratectomy; LASIK laser assisted in situ keratomileusis; HTN hypertension; DM diabetes mellitus; COPD chronic obstructive pulmonary disease      [1]  Allergies Allergen Reactions   Lisinopril      cough  [2]  Social History Tobacco Use   Smoking status: Every Day    Current packs/day: 1.00    Average packs/day: 1 pack/day for 33.0 years (33.0 ttl pk-yrs)    Types: Cigarettes   Smokeless tobacco: Never  Vaping Use   Vaping status: Never Used  Substance Use Topics   Alcohol use: Yes    Alcohol/week: 2.0 standard drinks of alcohol    Types: 2 Cans of beer per week   Drug use: No   "

## 2024-08-09 ENCOUNTER — Ambulatory Visit (INDEPENDENT_AMBULATORY_CARE_PROVIDER_SITE_OTHER): Payer: Medicare (Managed Care) | Admitting: Ophthalmology

## 2024-08-09 ENCOUNTER — Encounter (INDEPENDENT_AMBULATORY_CARE_PROVIDER_SITE_OTHER): Payer: Self-pay | Admitting: Ophthalmology

## 2024-08-09 DIAGNOSIS — H25813 Combined forms of age-related cataract, bilateral: Secondary | ICD-10-CM | POA: Diagnosis not present

## 2024-08-09 DIAGNOSIS — H3581 Retinal edema: Secondary | ICD-10-CM

## 2024-08-09 DIAGNOSIS — E119 Type 2 diabetes mellitus without complications: Secondary | ICD-10-CM

## 2024-08-09 DIAGNOSIS — H35033 Hypertensive retinopathy, bilateral: Secondary | ICD-10-CM

## 2024-08-09 DIAGNOSIS — I10 Essential (primary) hypertension: Secondary | ICD-10-CM | POA: Diagnosis not present

## 2025-08-08 ENCOUNTER — Encounter (INDEPENDENT_AMBULATORY_CARE_PROVIDER_SITE_OTHER): Payer: Medicare (Managed Care) | Admitting: Ophthalmology
